# Patient Record
Sex: Male | Born: 1985 | Race: Black or African American | Hispanic: No | Marital: Single | Smoking: Current every day smoker
Health system: Southern US, Community
[De-identification: ages and names within clinical notes are randomized; demographics above are authoritative.]

## PROBLEM LIST (undated history)

## (undated) DIAGNOSIS — J45909 Unspecified asthma, uncomplicated: Secondary | ICD-10-CM

## (undated) HISTORY — PX: HAND SURGERY: SHX662

---

## 2014-08-28 ENCOUNTER — Emergency Department (HOSPITAL_COMMUNITY): Payer: Self-pay

## 2014-08-28 ENCOUNTER — Encounter (HOSPITAL_COMMUNITY): Payer: Self-pay | Admitting: Emergency Medicine

## 2014-08-28 ENCOUNTER — Observation Stay (HOSPITAL_COMMUNITY)
Admission: EM | Admit: 2014-08-28 | Discharge: 2014-08-29 | Disposition: A | Discharge status: Home / Self Care | Payer: Self-pay | Attending: Emergency Medicine | Admitting: Emergency Medicine

## 2014-08-28 DIAGNOSIS — J45901 Unspecified asthma with (acute) exacerbation: Principal | ICD-10-CM | POA: Diagnosis present

## 2014-08-28 DIAGNOSIS — K226 Gastro-esophageal laceration-hemorrhage syndrome: Secondary | ICD-10-CM

## 2014-08-28 DIAGNOSIS — B9789 Other viral agents as the cause of diseases classified elsewhere: Secondary | ICD-10-CM

## 2014-08-28 DIAGNOSIS — J069 Acute upper respiratory infection, unspecified: Secondary | ICD-10-CM | POA: Insufficient documentation

## 2014-08-28 DIAGNOSIS — F17200 Nicotine dependence, unspecified, uncomplicated: Secondary | ICD-10-CM | POA: Insufficient documentation

## 2014-08-28 DIAGNOSIS — R000 Tachycardia, unspecified: Secondary | ICD-10-CM | POA: Insufficient documentation

## 2014-08-28 DIAGNOSIS — E876 Hypokalemia: Secondary | ICD-10-CM | POA: Insufficient documentation

## 2014-08-28 HISTORY — DX: Unspecified asthma, uncomplicated: J45.909

## 2014-08-28 LAB — CBC WITH DIFFERENTIAL/PLATELET
Basophils Absolute: 0.1 10*3/uL (ref 0.0–0.1)
Basophils Relative: 1 % (ref 0–1)
Eosinophils Absolute: 0.2 10*3/uL (ref 0.0–0.7)
Eosinophils Relative: 2 % (ref 0–5)
HCT: 43.8 % (ref 39.0–52.0)
Hemoglobin: 15.1 g/dL (ref 13.0–17.0)
Lymphocytes Relative: 21 % (ref 12–46)
Lymphs Abs: 2.2 10*3/uL (ref 0.7–4.0)
MCH: 29.2 pg (ref 26.0–34.0)
MCHC: 34.5 g/dL (ref 30.0–36.0)
MCV: 84.7 fL (ref 78.0–100.0)
Monocytes Absolute: 0.7 10*3/uL (ref 0.1–1.0)
Monocytes Relative: 7 % (ref 3–12)
Neutro Abs: 7.3 10*3/uL (ref 1.7–7.7)
Neutrophils Relative %: 69 % (ref 43–77)
Platelets: 287 10*3/uL (ref 150–400)
RBC: 5.17 MIL/uL (ref 4.22–5.81)
RDW: 12.8 % (ref 11.5–15.5)
WBC: 10.5 10*3/uL (ref 4.0–10.5)

## 2014-08-28 LAB — BASIC METABOLIC PANEL
Anion gap: 16 — ABNORMAL HIGH (ref 5–15)
BUN: 9 mg/dL (ref 6–23)
CO2: 22 mEq/L (ref 19–32)
Calcium: 9.1 mg/dL (ref 8.4–10.5)
Chloride: 101 mEq/L (ref 96–112)
Creatinine, Ser: 1.1 mg/dL (ref 0.50–1.35)
GFR calc Af Amer: >90 mL/min (ref >90)
GFR calc non Af Amer: >90 mL/min (ref >90)
Glucose, Bld: 111 mg/dL — ABNORMAL HIGH (ref 70–99)
Potassium: 3 mEq/L — ABNORMAL LOW (ref 3.7–5.3)
Sodium: 139 mEq/L (ref 137–147)

## 2014-08-28 MED ORDER — HYDROCODONE-ACETAMINOPHEN 5-325 MG PO TABS
2.0000 | ORAL_TABLET | Freq: Once | ORAL | Status: AC
  Administered 2014-08-28: 2 via ORAL
  Filled 2014-08-28: qty 2

## 2014-08-28 MED ORDER — METHYLPREDNISOLONE SODIUM SUCC 125 MG IJ SOLR
125.0000 mg | Freq: Once | INTRAMUSCULAR | Status: AC
  Administered 2014-08-29: 125 mg via INTRAVENOUS
  Filled 2014-08-28: qty 2

## 2014-08-28 MED ORDER — ALBUTEROL (5 MG/ML) CONTINUOUS INHALATION SOLN
10.0000 mg/h | INHALATION_SOLUTION | RESPIRATORY_TRACT | Status: AC
  Administered 2014-08-28: 10 mg/h via RESPIRATORY_TRACT
  Filled 2014-08-28: qty 20

## 2014-08-28 MED ORDER — HYDROCODONE-HOMATROPINE 5-1.5 MG/5ML PO SYRP
5.0000 mL | ORAL_SOLUTION | ORAL | Status: DC | PRN
  Administered 2014-08-29: 5 mL via ORAL
  Filled 2014-08-28: qty 5

## 2014-08-28 MED ORDER — IPRATROPIUM-ALBUTEROL 0.5-2.5 (3) MG/3ML IN SOLN
3.0000 mL | Freq: Once | RESPIRATORY_TRACT | Status: AC
Start: 2014-08-28 — End: 2014-08-28
  Administered 2014-08-28: 3 mL via RESPIRATORY_TRACT
  Filled 2014-08-28: qty 3

## 2014-08-28 MED ORDER — IPRATROPIUM BROMIDE 0.02 % IN SOLN
0.5000 mg | Freq: Once | RESPIRATORY_TRACT | Status: AC
  Administered 2014-08-28: 0.5 mg via RESPIRATORY_TRACT
  Filled 2014-08-28: qty 2.5

## 2014-08-28 MED ORDER — ALBUTEROL SULFATE (2.5 MG/3ML) 0.083% IN NEBU
2.5000 mg | INHALATION_SOLUTION | Freq: Once | RESPIRATORY_TRACT | Status: AC
  Administered 2014-08-28: 2.5 mg via RESPIRATORY_TRACT

## 2014-08-28 NOTE — ED Notes (Signed)
C/o sob, cough, wheeze and fever, onset Friday.

## 2014-08-28 NOTE — ED Provider Notes (Signed)
CSN: [id number]     Arrival date & time 08/28/14  2210 History   First MD Initiated Contact with Patient 08/28/14 2306     Chief Complaint  Patient presents with  . Shortness of Breath  . Cough  . Wheezing  . Fever     (Consider location/radiation/quality/duration/timing/severity/associated sxs/prior Treatment) The history is provided by the patient and a significant other. No language interpreter was used.    Jose Diaz is a 28 y.o. male  with a hx of asthma presents to the Emergency Department complaining of gradual, persistent, progressively worsening shortness of breath, chest pain and wheezing onset 3 days ago, worsening today.  Patient reports history of asthma since childhood. He is not followed by a primary care physician and does not have an albuterol inhaler at home. He reports URI symptoms including rhinorrhea, postnasal drip, sore throat, generalized headache and nausea beginning Friday with increasing shortness of breath worsening yesterday. He reports he was seen at [facility] emergency department, given by mouth prednisone and 3 breathing treatments. He reports he was discharged home with a prescription for steroids but was not given were prescribed albuterol inhaler. He reports his shortness of breath cough and wheezing increased again today while he was visiting family here in [place]. This prompted his visit to the emergency department tonight. No aggravating or alleviating factors..  Pt denies neck pain, neck stiffness, abdominal pain, nausea vomiting, diarrhea, weakness, dizziness and syncope.     Past Medical History  Diagnosis Date  . Asthma    Past Surgical History  Procedure Laterality Date  . Hand surgery     History reviewed. No pertinent family history. History  Substance Use Topics  . Smoking status: Current Every Day Smoker  . Smokeless tobacco: Not on file  . Alcohol Use: No    Review of Systems  Constitutional: Positive for fever  (subjective) and fatigue. Negative for chills and appetite change.  HENT: Positive for congestion, postnasal drip, rhinorrhea, sinus pressure and sore throat. Negative for ear discharge, ear pain and mouth sores.   Eyes: Negative for visual disturbance.  Respiratory: Positive for cough, chest tightness, shortness of breath and wheezing. Negative for stridor.   Cardiovascular: Negative for chest pain, palpitations and leg swelling.  Gastrointestinal: Negative for nausea, vomiting, abdominal pain and diarrhea.  Genitourinary: Negative for dysuria, urgency, frequency and hematuria.  Musculoskeletal: Negative for arthralgias, back pain, myalgias and neck stiffness.  Skin: Negative for rash.  Neurological: Positive for headaches. Negative for syncope, light-headedness and numbness.  Hematological: Negative for adenopathy.  Psychiatric/Behavioral: The patient is not nervous/anxious.   All other systems reviewed and are negative.     Allergies  Review of patient's allergies indicates no known allergies.  Home Medications   Prior to Admission medications   Medication Sig Start Date End Date Taking? Authorizing Provider  albuterol (PROVENTIL HFA;VENTOLIN HFA) 108 (90 BASE) MCG/ACT inhaler Inhale 2 puffs into the lungs every 4 (four) hours as needed for wheezing or shortness of breath. 08/29/14   Alynna Hargrove, PA-C  albuterol (PROVENTIL) (2.5 MG/3ML) 0.083% nebulizer solution Take 6 mLs (5 mg total) by nebulization every 4 (four) hours as needed for wheezing or shortness of breath. 08/29/14   Terrah Decoster, PA-C  fluticasone (FLONASE) 50 MCG/ACT nasal spray Place 2 sprays into both nostrils daily. 08/29/14   Nealy Hickmon, PA-C  guaiFENesin (MUCINEX) 600 MG 12 hr tablet Take 2 tablets (1,200 mg total) by mouth 2 (two) times daily. 08/29/14   Dahlia Client Jamauri Kruzel,  PA-C  HYDROcodone-homatropine (HYCODAN) 5-1.5 MG/5ML syrup Take 5 mLs by mouth every 6 (six) hours as needed for cough.  08/29/14   Adaira Centola, PA-C   BP 155/74  Pulse 106  Temp(Src) 99.6 F (37.6 C) (Oral)  Resp 20  Ht 5\' 9"  (1.753 m)  Wt 240 lb (108.863 kg)  BMI 35.43 kg/m2  SpO2 95% Physical Exam  Nursing note and vitals reviewed. Constitutional: He is oriented to person, place, and time. He appears well-developed and well-nourished. No distress.  Awake, alert, nontoxic appearance  HENT:  Head: Normocephalic and atraumatic.  Right Ear: Tympanic membrane, external ear and ear canal normal.  Left Ear: Tympanic membrane, external ear and ear canal normal.  Nose: Mucosal edema and rhinorrhea present. No epistaxis. Right sinus exhibits no maxillary sinus tenderness and no frontal sinus tenderness. Left sinus exhibits no maxillary sinus tenderness and no frontal sinus tenderness.  Mouth/Throat: Uvula is midline, oropharynx is clear and moist and mucous membranes are normal. Mucous membranes are not pale and not cyanotic. No oropharyngeal exudate, posterior oropharyngeal edema, posterior oropharyngeal erythema or tonsillar abscesses.  Eyes: Conjunctivae are normal. Pupils are equal, round, and reactive to light. No scleral icterus.  Neck: Normal range of motion and full passive range of motion without pain. Neck supple.  Cardiovascular: Normal rate, regular rhythm, normal heart sounds and intact distal pulses.   Mild tachycardia  Pulmonary/Chest: Accessory muscle usage present. No stridor. Tachypnea noted. No respiratory distress. He has decreased breath sounds. He has wheezes. He exhibits tenderness.  Equal chest expansion Decreased breath sounds with bilateral inspiratory and expiratory wheezes, supra-clavicular retractions and mild accessory use, cachectic Significant tenderness of the anterior chest on palpation  Abdominal: Soft. Bowel sounds are normal. He exhibits no mass. There is no tenderness. There is no rebound and no guarding.  Musculoskeletal: Normal range of motion. He exhibits no edema.   Lymphadenopathy:    He has no cervical adenopathy.  Neurological: He is alert and oriented to person, place, and time. He exhibits normal muscle tone. Coordination normal.  Speech is clear and goal oriented Moves extremities without ataxia  Skin: Skin is warm and dry. No rash noted. He is not diaphoretic.  Psychiatric: He has a normal mood and affect.    ED Course  Procedures (including critical care time) Labs Review Labs Reviewed  BASIC METABOLIC PANEL - Abnormal; Notable for the following:    Potassium 3.0 (*)    Glucose, Bld 111 (*)    Anion gap 16 (*)    All other components within normal limits  CBC WITH DIFFERENTIAL    Imaging Review Dg Chest 2 View  08/29/2014   CLINICAL DATA:  Acute onset of bilateral chest pain and shortness of breath. Current history of moderate asthma. Initial encounter.  EXAM: CHEST  2 VIEW  COMPARISON:  None.  FINDINGS: The lungs are well-aerated. Peribronchial thickening likely reflects the patient's history of asthma. There is no evidence of focal opacification, pleural effusion or pneumothorax.  The heart is normal in size; the mediastinal contour is within normal limits. No acute osseous abnormalities are seen.  IMPRESSION: 1. Peribronchial thickening likely reflects the patient's history of asthma. 2. Otherwise unremarkable chest radiograph.   Electronically Signed   By: Roanna Raider M.D.   On: 08/29/2014 00:22     EKG Interpretation None      MDM   Final diagnoses:  Hypokalemia  Asthma exacerbation  Viral URI with cough   Jose Diaz presents with asthma  exacerbation and URI.  Patient given initial do a neb at triage with minimal improvement. We'll give continuous nebulizer and reassess. Patient will be given Solu-Medrol.  1:27 AM Patient ambulated in ED with O2 saturations maintained >90, no current signs of respiratory distress. Lung exam improved after nebulizer treatment. Prednisone given in the ED and pt will be dc with 5  day burst. Pt states they are breathing at baseline. Pt CXR negative for acute infiltrate. Patients symptoms are consistent with URI, likely viral etiology. Discussed that antibiotics are not indicated for viral infections.   BP 141/70  Pulse 90  Temp(Src) 97.8 F (36.6 C) (Oral)  Resp 16  Ht 5\' 9"  (1.753 m)  Wt 240 lb (108.863 kg)  BMI 35.43 kg/m2  SpO2 99%   3:17 AM Just prior to discharge patient vomited; retching significantly with second episode including large amount of bright red blood. Hemoccult card positive for bloody emesis.  Patient without abdominal pain.  Abdomen remaines soft and nontender.  Likely Mallory-Weiss tear after significant retching.  Patient has risk factors for significant bleeding including alcoholism, NSAID use, anticoagulants, history of peptic ulcer disease. Nausea and emesis is likely secondary to administration of potassium on an empty stomach.  Pt reports feeling SOB again with inspiratory wheezes.  Pt with mild hypoxia at 90-91% while at rest, down from 98% while ambulating approx 1 hour ago.  Pt also with tachycardia.  Pt seen and treated last night in ED without continued resolution at this time.  Will redose albuterol and admit .  The patient was discussed with and seen by Dr. Juleen ChinaKohut who agrees with the treatment plan.   3:36 AM Discussed with Dr. Toniann FailKakrakandy who will admit to med-surg.  3:44 AM Pt with tachycardia to 140 - bed placement changed to tele.  3:48 AM Pt has now eloped from the ED after removing his IV.    BP 155/74  Pulse 106  Temp(Src) 99.6 F (37.6 C) (Oral)  Resp 20  Ht 5\' 9"  (1.753 m)  Wt 240 lb (108.863 kg)  BMI 35.43 kg/m2  SpO2 95%   Jose ForthHannah Ayush Boulet, PA-C 08/29/14 [phone]

## 2014-08-28 NOTE — ED Notes (Signed)
The pt just went for a chest xray

## 2014-08-29 DIAGNOSIS — J45901 Unspecified asthma with (acute) exacerbation: Secondary | ICD-10-CM | POA: Diagnosis present

## 2014-08-29 MED ORDER — POTASSIUM CHLORIDE 10 MEQ/100ML IV SOLN
10.0000 meq | INTRAVENOUS | Status: DC
Start: 2014-08-29 — End: 2014-08-29

## 2014-08-29 MED ORDER — IPRATROPIUM BROMIDE 0.02 % IN SOLN: 1.0000 mg | Freq: Once | RESPIRATORY_TRACT | Status: DC

## 2014-08-29 MED ORDER — GUAIFENESIN ER 600 MG PO TB12: 1200.0000 mg | ORAL_TABLET | Freq: Two times a day (BID) | ORAL | Status: DC

## 2014-08-29 MED ORDER — HYDROCODONE-HOMATROPINE 5-1.5 MG/5ML PO SYRP: 5.0000 mL | ORAL_SOLUTION | Freq: Four times a day (QID) | ORAL | Status: DC | PRN

## 2014-08-29 MED ORDER — ONDANSETRON HCL 4 MG/2ML IJ SOLN: 4.0000 mg | Freq: Three times a day (TID) | INTRAMUSCULAR | Status: DC | PRN

## 2014-08-29 MED ORDER — ALBUTEROL (5 MG/ML) CONTINUOUS INHALATION SOLN: 10.0000 mg/h | INHALATION_SOLUTION | RESPIRATORY_TRACT | Status: DC

## 2014-08-29 MED ORDER — ALBUTEROL SULFATE (2.5 MG/3ML) 0.083% IN NEBU: 5.0000 mg | INHALATION_SOLUTION | RESPIRATORY_TRACT | Status: DC | PRN

## 2014-08-29 MED ORDER — PROMETHAZINE HCL 25 MG PO TABS: 25.0000 mg | ORAL_TABLET | Freq: Four times a day (QID) | ORAL | Status: DC | PRN

## 2014-08-29 MED ORDER — ALBUTEROL SULFATE (2.5 MG/3ML) 0.083% IN NEBU
5.0000 mg | INHALATION_SOLUTION | Freq: Once | RESPIRATORY_TRACT | Status: AC
  Administered 2014-08-29: 5 mg via RESPIRATORY_TRACT
  Filled 2014-08-29: qty 6

## 2014-08-29 MED ORDER — AEROCHAMBER PLUS W/MASK MISC
1.0000 | Freq: Once | Status: DC
  Filled 2014-08-29: qty 1

## 2014-08-29 MED ORDER — POTASSIUM CHLORIDE CRYS ER 20 MEQ PO TBCR
80.0000 meq | EXTENDED_RELEASE_TABLET | Freq: Two times a day (BID) | ORAL | Status: DC
  Administered 2014-08-29: 80 meq via ORAL
  Filled 2014-08-29: qty 4

## 2014-08-29 MED ORDER — ALBUTEROL SULFATE HFA 108 (90 BASE) MCG/ACT IN AERS: 2.0000 | INHALATION_SPRAY | RESPIRATORY_TRACT | Status: DC | PRN

## 2014-08-29 MED ORDER — FLUTICASONE PROPIONATE 50 MCG/ACT NA SUSP: 2.0000 | Freq: Every day | NASAL | Status: DC

## 2014-08-29 MED ORDER — ALBUTEROL SULFATE HFA 108 (90 BASE) MCG/ACT IN AERS
2.0000 | INHALATION_SPRAY | RESPIRATORY_TRACT | Status: DC | PRN
Start: 2014-08-29 — End: 2014-08-29
  Administered 2014-08-29: 2 via RESPIRATORY_TRACT
  Filled 2014-08-29: qty 6.7

## 2014-08-29 MED ORDER — ONDANSETRON HCL 4 MG/2ML IJ SOLN
4.0000 mg | Freq: Once | INTRAMUSCULAR | Status: AC
  Administered 2014-08-29: 4 mg via INTRAVENOUS
  Filled 2014-08-29: qty 2

## 2014-08-29 NOTE — ED Notes (Signed)
While ambulating pt he was sat at 98% the whole time while walking, pt did complain of feeling tired while walking, but states that his breathing seems to be better then when he first arrived.

## 2014-08-29 NOTE — ED Notes (Signed)
Bed cancelled

## 2014-08-29 NOTE — ED Notes (Signed)
The pt was not in his room when i came back from talking to the pa.  His iv had been pulled out .  All his leads pulse ox etc lying in the  Floor.  He had wanted to be admitted     A little earlier.  No one saw him and the male with him walk out.  He left his inhaler and spacer

## 2014-08-29 NOTE — Discharge Instructions (Signed)
1. Medications: flonase, mucinex, hycodan, albuterol neb, albuterol mdi, usual home medications 2. Treatment: rest, drink plenty of fluids, take tylenol or ibuprofen for fever control 3. Follow Up: Please followup with your primary doctor for discussion of your diagnoses and further evaluation after today's visit; if you do not have a primary care doctor use the resource guide provided to find one;    Asthma, Acute Bronchospasm Acute bronchospasm caused by asthma is also referred to as an asthma attack. Bronchospasm means your air passages become narrowed. The narrowing is caused by inflammation and tightening of the muscles in the air tubes (bronchi) in your lungs. This can make it hard to breathe or cause you to wheeze and cough. CAUSES Possible triggers are:  Animal dander from the skin, hair, or feathers of animals.  Dust mites contained in house dust.  Cockroaches.  Pollen from trees or grass.  Mold.  Cigarette or tobacco smoke.  Air pollutants such as dust, household cleaners, hair sprays, aerosol sprays, paint fumes, strong chemicals, or strong odors.  Cold air or weather changes. Cold air may trigger inflammation. Winds increase molds and pollens in the air.  Strong emotions such as crying or laughing hard.  Stress.  Certain medicines such as aspirin or beta-blockers.  Sulfites in foods and drinks, such as dried fruits and wine.  Infections or inflammatory conditions, such as a flu, cold, or inflammation of the nasal membranes (rhinitis).  Gastroesophageal reflux disease (GERD). GERD is a condition where stomach acid backs up into your esophagus.  Exercise or strenuous activity. SIGNS AND SYMPTOMS   Wheezing.  Excessive coughing, particularly at night.  Chest tightness.  Shortness of breath. DIAGNOSIS  Your health care provider will ask you about your medical history and perform a physical exam. A chest X-ray or blood testing may be performed to look for other  causes of your symptoms or other conditions that may have triggered your asthma attack. TREATMENT  Treatment is aimed at reducing inflammation and opening up the airways in your lungs. Most asthma attacks are treated with inhaled medicines. These include quick relief or rescue medicines (such as bronchodilators) and controller medicines (such as inhaled corticosteroids). These medicines are sometimes given through an inhaler or a nebulizer. Systemic steroid medicine taken by mouth or given through an IV tube also can be used to reduce the inflammation when an attack is moderate or severe. Antibiotic medicines are only used if a bacterial infection is present.  HOME CARE INSTRUCTIONS   Rest.  Drink plenty of liquids. This helps the mucus to remain thin and be easily coughed up. Only use caffeine in moderation and do not use alcohol until you have recovered from your illness.  Do not smoke. Avoid being exposed to secondhand smoke.  You play a critical role in keeping yourself in good health. Avoid exposure to things that cause you to wheeze or to have breathing problems.  Keep your medicines up-to-date and available. Carefully follow your health care provider's treatment plan.  Take your medicine exactly as prescribed.  When pollen or pollution is bad, keep windows closed and use an air conditioner or go to places with air conditioning.  Asthma requires careful medical care. See your health care provider for a follow-up as advised. If you are more than [redacted] weeks pregnant and you were prescribed any new medicines, let your obstetrician know about the visit and how you are doing. Follow up with your health care provider as directed.  After you have recovered  from your asthma attack, make an appointment with your outpatient doctor to talk about ways to reduce the likelihood of future attacks. If you do not have a doctor who manages your asthma, make an appointment with a primary care doctor to  discuss your asthma. SEEK IMMEDIATE MEDICAL CARE IF:   You are getting worse.  You have trouble breathing. If severe, call your local emergency services (911 in the U.[place]  You develop chest pain or discomfort.  You are vomiting.  You are not able to keep fluids down.  You are coughing up yellow, green, brown, or bloody sputum.  You have a fever and your symptoms suddenly get worse.  You have trouble swallowing. MAKE SURE YOU:   Understand these instructions.  Will watch your condition.  Will get help right away if you are not doing well or get worse. Document Released: 02/26/2007 Document Revised: 11/16/2013 Document Reviewed: 05/19/2013 [facility] Patient Information 2015 [place], [place]. This information is not intended to replace advice given to you by your health care provider. Make sure you discuss any questions you have with your health care provider.   Upper Respiratory Infection, Adult An upper respiratory infection (URI) is also known as the common cold. It is often caused by a type of germ (virus). Colds are easily spread (contagious). You can pass it to others by kissing, coughing, sneezing, or drinking out of the same glass. Usually, you get better in 1 or 2 weeks.  HOME CARE   Only take medicine as told by your doctor.  Use a warm mist humidifier or breathe in steam from a hot shower.  Drink enough water and fluids to keep your pee (urine) clear or pale yellow.  Get plenty of rest.  Return to work when your temperature is back to normal or as told by your doctor. You may use a face mask and wash your hands to stop your cold from spreading. GET HELP RIGHT AWAY IF:   After the first few days, you feel you are getting worse.  You have questions about your medicine.  You have chills, shortness of breath, or brown or red spit (mucus).  You have yellow or brown snot (nasal discharge) or pain in the face, especially when you bend forward.  You have a fever,  puffy (swollen) neck, pain when you swallow, or white spots in the back of your throat.  You have a bad headache, ear pain, sinus pain, or chest pain.  You have a high-pitched whistling sound when you breathe in and out (wheezing).  You have a lasting cough or cough up blood.  You have sore muscles or a stiff neck. MAKE SURE YOU:   Understand these instructions.  Will watch your condition.  Will get help right away if you are not doing well or get worse. Document Released: 04/29/2008 Document Revised: 02/03/2012 Document Reviewed: 02/16/2014 [facility] Patient Information 2015 [place], [place]. This information is not intended to replace advice given to you by your health care provider. Make sure you discuss any questions you have with your health care provider.   Emergency Department Resource Guide 1) Find a Doctor and Pay Out of Pocket Although you won't have to find out who is covered by your insurance plan, it is a good idea to ask around and get recommendations. You will then need to call the office and see if the doctor you have chosen will accept you as a new patient and what types of options they offer for patients  who are self-pay. Some doctors offer discounts or will set up payment plans for their patients who do not have insurance, but you will need to ask so you aren't surprised when you get to your appointment.  2) Contact Your Local Health Department Not all health departments have doctors that can see patients for sick visits, but many do, so it is worth a call to see if yours does. If you don't know where your local health department is, you can check in your phone book. The CDC also has a tool to help you locate your state's health department, and many state websites also have listings of all of their local health departments.  3) Find a Walk-in Clinic If your illness is not likely to be very severe or complicated, you may want to try a walk in clinic. These are popping up  all over the country in pharmacies, drugstores, and shopping centers. They're usually staffed by nurse practitioners or physician assistants that have been trained to treat common illnesses and complaints. They're usually fairly quick and inexpensive. However, if you have serious medical issues or chronic medical problems, these are probably not your best option.  No Primary Care Doctor: - Call Health Connect at  [phone] - they can help you locate a primary care doctor that  accepts your insurance, provides certain services, etc. - Physician Referral Service- [phone]  Chronic Pain Problems: Organization         Address  Phone   Notes  [facility]  [phone] Patients need to be referred by their primary care doctor.   Medication Assistance: Organization         Address  Phone   Notes  [facility] Medication [facility] [address] [place] [place], [place] [address] [phone] --Must be a resident of [facility] -- Must have NO insurance coverage whatsoever (no Medicaid/ Medicare, etc.) -- The pt. MUST have a primary care doctor that directs their care regularly and follows them in the community   [facility]  [phone]   [employer]  [phone]    Agencies that provide inexpensive medical care: Organization         Address  Phone   Notes  [facility]  [phone]   [facility]    [phone]   [facility] [address] [place], [place] [address] [phone]   [facility] [address] [place], [place] [phone]   [facility]anned Parenthood    [phone]   [facility]    [phone]   [facility] [place]  [address], [place] Phone:  [phone], Fax:  [phone] Hours of Operation:  9 am - 6 pm, M-F.  Also accepts Medicaid/Medicare and self-pay.  [facility]  [address], [place] Phone: [phone], Fax: [phone]. Hours of Operation:  8:30 am - 5:30 pm, M-F.  Also accepts Medicaid and self-pay.  [facility] [place] [address], [place] Phone: [phone]   [facility] [address] Natasha Bence [place], [place] [phone], Ext. [phone] Mondays & Thursdays: 7-9 AM.  First 15 patients are seen on a first come, first serve basis.    Medicaid-accepting [facility] Providers:  Organization         Address  Phone   Notes  [facility] [address], [place] [phone] Also accepts self-pay patients.  [facility] [address] Laurell Josephs [place], [place]  [phone]   [facility] [address], [place] [phone]   [facility] [address] [place] [phone]   Renaye Rakers [address], [place]   [phone] Only accepts [place] Access [place] patients after they have their name applied to their card.   Self-Pay (no insurance) in [facility]:  Organization         Address  Phone   Notes  Sickle Cell Patients, [facility] [address] [place], [place] [phone]   [facility] [address] [place], [place] [phone]   [facility]  [address], [place] [phone]   [facility]/Dr. Osei-Bonsu  [address], [place] or [address], [place] [phone] Phone number for both [place] and [place] locations is the same.  Urgent Medical and [facility] [address] [place] [phone]   [facility] [address] [place] or [address] [phone]   [facility] [address], [place] [phone], phone; [phone], fax Sees patients 1st and 3rd Saturday of every  month.  Must not qualify for public or private insurance (i.e. Medicaid, Medicare, [place] Health Choice, Veterans' Benefits)  Household income should be no more than 200% of the poverty level The clinic cannot treat you if you are pregnant or think you are pregnant  Sexually transmitted diseases are not treated at the clinic.    Dental Care: Organization         Address  Phone  Notes  [facility] Department of [facility] [address] [place], [place] [phone] Accepts children up to age 21 who are enrolled in [place] or [place] Health Choice; pregnant women with a Medicaid card; and children who have applied for Medicaid or [place] Health Choice, but were declined, whose parents can pay a reduced fee at time of service.  [facility] of [facility]  [address], [place] [phone] Accepts children up to age 53 who are enrolled in [place] or [place] Health Choice; pregnant women with a Medicaid card; and children who have applied for Medicaid or  Health Choice, but were declined, whose parents can pay a reduced fee at time of service.  [facility]  [address] [place], [place] [phone] Patients are seen by appointment only. Walk-ins are not accepted. [facility] will see patients 8 years of age and older. Monday - Tuesday (8am-5pm) Most Wednesdays (8:30-5pm) $30 per visit, cash only  [facility]  [address], [facility] [phone] Patients are seen by appointment only. Walk-ins are not accepted. [facility] will see patients 20 years of age and older. One Wednesday Evening (Monthly: Volunteer Based).  $30 per visit, cash only  [employer]  [phone] for adults; Children under age 4, call Graduate Pediatric Dentistry at [phone]. Children aged 62-14, please call [phone] to request a pediatric application.  Dental  services are provided in all areas of dental care including fillings, crowns and bridges, complete and partial dentures, implants, gum treatment, root  canals, and extractions. Preventive care is also provided. Treatment is provided to both adults and children. Patients are selected via a lottery and there is often a waiting list.   [facility] [address] [place]  [phone] [email or web address]   [facility] [address] [place], [place] [phone], Ext. [phone] Second and Fourth Thursday of each month, opens at 6:30 AM; Clinic ends at 9 AM.  Patients are seen on a first-come first-served basis, and a limited number are seen during each clinic.   [facility]  [address] Ether Griffins [place], [place] [phone]   Eligibility Requirements You must have lived in [place], [place], or [place] counties for at least the last three months.   You cannot be eligible for state or federal sponsored [place], including [employer], [place], or [employer].   You generally cannot be eligible for healthcare insurance through your employer.    How to apply: Eligibility screenings are held every Tuesday and Wednesday afternoon from 1:00 pm until 4:00 pm. You do not need an appointment for the interview!  [facility] [address] [place], [place] [phone]   [facility] Health Department  [phone]   [facility] Health Department  [phone]   [facility] Health Department  [phone]    Behavioral Health Resources in the Community: Intensive Outpatient Programs Organization         Address  Phone  Notes  [facility] [address] [place], [place] [phone]   [facility] [address], [place], [place] [phone]   [facility] [address], [place], [place]  [phone]   [facility] Mental Health [address],    [place], [place] [phone] or [phone]   Substance Abuse Resources Organization         Address  Phone  Notes  [facility] Drug Services  [phone]   [facility]  [phone]   [facility] [place]  [phone]   Floydene Flock  [phone]   Residential & Outpatient Substance Abuse Program  [phone]   Psychological Services Organization         Address  Phone  Notes  [facility] Behavioral Health  [phone]   [facility] Services  [phone]   [facility] Mental Health [address] [place] [phone] or [phone]    Mobile Crisis Teams Organization         Address  Phone  Notes  [facility], Mobile Crisis Care Unit  [phone]   [facility]  [address] [place], [place] [phone]   Doristine Locks [address] [place] [phone]    Self-Help/Support Groups Organization         Address  Phone             Notes  [facility] [place] - variety of support groups  336- I7437963 Call for more information  Narcotics Anonymous (NA), [facility] [address] [place] [address], [employer] [place]  2 meetings at this location   Statistician         Address  Phone  Notes  [facility] [address],    [place]  [phone]   [facility]  [address], [place] [address], [place], [place] [phone]   [facility] [address] [place], [place] [phone] Admissions: 8am-3pm M-F  [facility]  [address],    [place], [place] [phone]   [facility] [address] [place], [place], [place] [phone]   [facility] [address]  [place], [place] [phone]   [facility] - Intensive Outpatient [address] Laurell Josephs [address], [place], [place] [phone]   [facility] ([facility] [address] [place]  [place], [place]  [phone] or [phone]   [facility] ([facility]) [address] [place] [address], [place], [place] [phone] Accepts Medicaid  [facility] [address]  [place] [phone] Substance Abuse/Addiction Treatment   [facility] Organization         Address  Phone  Notes  [facility]  9147910028   Angie Fava, PhD [address] Ervin Knack [place], [place]   [phone] or [phone]   [facility] Behavioral   [address] [place], [place] [phone]   [facility] [address], [employer] [employer], [place] [phone] Insurance/Medicaid/sponsorship through [facility] and Families [address]                                    [place], [place] [phone] Therapy/tele-psych/case  [facility] [address], [place] [phone]    Dr. Lolly Mustache  [phone]   [facility]  [employer] [facility] Dept. 1) [address], [place] 2) [address] [place] 3)  [address], [place] [phone]  [phone]   [facility] Child Abuse Hotline 423-059-7079 or [phone] (After Hours)

## 2014-08-30 ENCOUNTER — Emergency Department (HOSPITAL_COMMUNITY): Payer: MEDICAID

## 2014-08-30 ENCOUNTER — Encounter (HOSPITAL_COMMUNITY): Payer: Self-pay | Admitting: Emergency Medicine

## 2014-08-30 ENCOUNTER — Inpatient Hospital Stay (HOSPITAL_COMMUNITY)
Admission: EM | Admit: 2014-08-30 | Discharge: 2014-09-02 | DRG: 202 | Disposition: A | Discharge status: Home / Self Care | Payer: Self-pay | Attending: Internal Medicine | Admitting: Internal Medicine

## 2014-08-30 DIAGNOSIS — F17210 Nicotine dependence, cigarettes, uncomplicated: Secondary | ICD-10-CM | POA: Diagnosis present

## 2014-08-30 DIAGNOSIS — E873 Alkalosis: Secondary | ICD-10-CM

## 2014-08-30 DIAGNOSIS — J189 Pneumonia, unspecified organism: Secondary | ICD-10-CM | POA: Diagnosis present

## 2014-08-30 DIAGNOSIS — D72829 Elevated white blood cell count, unspecified: Secondary | ICD-10-CM | POA: Diagnosis present

## 2014-08-30 DIAGNOSIS — E874 Mixed disorder of acid-base balance: Secondary | ICD-10-CM | POA: Diagnosis present

## 2014-08-30 DIAGNOSIS — E872 Acidosis, unspecified: Secondary | ICD-10-CM

## 2014-08-30 DIAGNOSIS — Z720 Tobacco use: Secondary | ICD-10-CM

## 2014-08-30 DIAGNOSIS — J45901 Unspecified asthma with (acute) exacerbation: Principal | ICD-10-CM | POA: Diagnosis present

## 2014-08-30 DIAGNOSIS — Z23 Encounter for immunization: Secondary | ICD-10-CM

## 2014-08-30 DIAGNOSIS — J9601 Acute respiratory failure with hypoxia: Secondary | ICD-10-CM | POA: Diagnosis present

## 2014-08-30 DIAGNOSIS — J9600 Acute respiratory failure, unspecified whether with hypoxia or hypercapnia: Secondary | ICD-10-CM | POA: Diagnosis present

## 2014-08-30 DIAGNOSIS — R079 Chest pain, unspecified: Secondary | ICD-10-CM | POA: Diagnosis present

## 2014-08-30 DIAGNOSIS — Z590 Homelessness: Secondary | ICD-10-CM

## 2014-08-30 DIAGNOSIS — R9431 Abnormal electrocardiogram [ECG] [EKG]: Secondary | ICD-10-CM | POA: Diagnosis present

## 2014-08-30 DIAGNOSIS — K219 Gastro-esophageal reflux disease without esophagitis: Secondary | ICD-10-CM | POA: Diagnosis present

## 2014-08-30 LAB — BASIC METABOLIC PANEL
Anion gap: 21 — ABNORMAL HIGH (ref 5–15)
BUN: 12 mg/dL (ref 6–23)
CALCIUM: 9.3 mg/dL (ref 8.4–10.5)
CO2: 15 meq/L — AB (ref 19–32)
CREATININE: 0.95 mg/dL (ref 0.50–1.35)
Chloride: 105 mEq/L (ref 96–112)
GFR calc Af Amer: >90 mL/min (ref >90)
Glucose, Bld: 133 mg/dL — ABNORMAL HIGH (ref 70–99)
Potassium: 4.1 mEq/L (ref 3.7–5.3)
SODIUM: 141 meq/L (ref 137–147)

## 2014-08-30 LAB — I-STAT ARTERIAL BLOOD GAS, ED
ACID-BASE DEFICIT: 4 mmol/L — AB (ref 0.0–2.0)
BICARBONATE: 18.2 meq/L — AB (ref 20.0–24.0)
O2 SAT: 94 %
Patient temperature: 98.6
TCO2: 19 mmol/L (ref 0–100)
pCO2 arterial: 24.4 mmHg — ABNORMAL LOW (ref 35.0–45.0)
pH, Arterial: 7.48 — ABNORMAL HIGH (ref 7.350–7.450)
pO2, Arterial: 64 mmHg — ABNORMAL LOW (ref 80.0–100.0)

## 2014-08-30 LAB — CBC
HCT: 42 % (ref 39.0–52.0)
HEMATOCRIT: 37.4 % — AB (ref 39.0–52.0)
Hemoglobin: 14.5 g/dL (ref 13.0–17.0)
Hemoglobin: 13 g/dL (ref 13.0–17.0)
MCH: 29.9 pg (ref 26.0–34.0)
MCH: 29.7 pg (ref 26.0–34.0)
MCHC: 34.5 g/dL (ref 30.0–36.0)
MCHC: 34.8 g/dL (ref 30.0–36.0)
MCV: 86.6 fL (ref 78.0–100.0)
MCV: 85.6 fL (ref 78.0–100.0)
Platelets: 344 10*3/uL (ref 150–400)
Platelets: 326 10*3/uL (ref 150–400)
RBC: 4.85 MIL/uL (ref 4.22–5.81)
RBC: 4.37 MIL/uL (ref 4.22–5.81)
RDW: 13.4 % (ref 11.5–15.5)
RDW: 13.3 % (ref 11.5–15.5)
WBC: 13 10*3/uL — ABNORMAL HIGH (ref 4.0–10.5)
WBC: 14 10*3/uL — ABNORMAL HIGH (ref 4.0–10.5)

## 2014-08-30 LAB — I-STAT TROPONIN, ED: TROPONIN I, POC: 0.01 ng/mL (ref 0.00–0.08)

## 2014-08-30 LAB — RAPID URINE DRUG SCREEN, HOSP PERFORMED
AMPHETAMINES: NOT DETECTED
Barbiturates: NOT DETECTED
Benzodiazepines: NOT DETECTED
Cocaine: NOT DETECTED
Opiates: NOT DETECTED
Tetrahydrocannabinol: POSITIVE — AB

## 2014-08-30 LAB — CREATININE, SERUM
CREATININE: 0.94 mg/dL (ref 0.50–1.35)
GFR calc Af Amer: >90 mL/min (ref >90)
GFR calc non Af Amer: >90 mL/min (ref >90)

## 2014-08-30 MED ORDER — DEXTROSE 5 % IV SOLN
500.0000 mg | INTRAVENOUS | Status: DC
  Administered 2014-08-30 – 2014-09-01 (×3): 500 mg via INTRAVENOUS
  Filled 2014-08-30 (×5): qty 500

## 2014-08-30 MED ORDER — DEXTROSE 5 % IV SOLN
1.0000 g | INTRAVENOUS | Status: DC
  Administered 2014-08-30 – 2014-09-01 (×3): 1 g via INTRAVENOUS
  Filled 2014-08-30 (×4): qty 10

## 2014-08-30 MED ORDER — HEPARIN SODIUM (PORCINE) 5000 UNIT/ML IJ SOLN
5000.0000 [IU] | Freq: Three times a day (TID) | INTRAMUSCULAR | Status: DC
  Administered 2014-08-30 – 2014-08-31 (×2): 5000 [IU] via SUBCUTANEOUS
  Filled 2014-08-30 (×11): qty 1

## 2014-08-30 MED ORDER — LORAZEPAM 2 MG/ML IJ SOLN
0.5000 mg | Freq: Once | INTRAMUSCULAR | Status: AC
  Administered 2014-08-30: 0.5 mg via INTRAVENOUS
  Filled 2014-08-30: qty 1

## 2014-08-30 MED ORDER — IPRATROPIUM-ALBUTEROL 0.5-2.5 (3) MG/3ML IN SOLN
3.0000 mL | Freq: Once | RESPIRATORY_TRACT | Status: AC
  Administered 2014-08-30: 3 mL via RESPIRATORY_TRACT

## 2014-08-30 MED ORDER — METHYLPREDNISOLONE SODIUM SUCC 125 MG IJ SOLR
125.0000 mg | Freq: Once | INTRAMUSCULAR | Status: AC
  Administered 2014-08-30: 125 mg via INTRAVENOUS
  Filled 2014-08-30: qty 2

## 2014-08-30 MED ORDER — ALBUTEROL (5 MG/ML) CONTINUOUS INHALATION SOLN
10.0000 mg/h | INHALATION_SOLUTION | RESPIRATORY_TRACT | Status: AC
  Administered 2014-08-30: 10 mg/h via RESPIRATORY_TRACT
  Filled 2014-08-30: qty 20

## 2014-08-30 MED ORDER — IPRATROPIUM-ALBUTEROL 0.5-2.5 (3) MG/3ML IN SOLN: 3.0000 mL | RESPIRATORY_TRACT | Status: DC | PRN

## 2014-08-30 MED ORDER — ONDANSETRON HCL 4 MG/2ML IJ SOLN
4.0000 mg | Freq: Four times a day (QID) | INTRAMUSCULAR | Status: DC | PRN
  Administered 2014-08-30: 4 mg via INTRAVENOUS
  Filled 2014-08-30: qty 2

## 2014-08-30 MED ORDER — IPRATROPIUM-ALBUTEROL 0.5-2.5 (3) MG/3ML IN SOLN
3.0000 mL | RESPIRATORY_TRACT | Status: DC
  Administered 2014-08-30 (×2): 3 mL via RESPIRATORY_TRACT
  Filled 2014-08-30 (×2): qty 3

## 2014-08-30 MED ORDER — METHYLPREDNISOLONE SODIUM SUCC 125 MG IJ SOLR
125.0000 mg | Freq: Three times a day (TID) | INTRAMUSCULAR | Status: DC
  Administered 2014-08-30 – 2014-08-31 (×2): 125 mg via INTRAVENOUS
  Filled 2014-08-30 (×6): qty 2

## 2014-08-30 MED ORDER — ONDANSETRON HCL 4 MG PO TABS: 4.0000 mg | ORAL_TABLET | Freq: Four times a day (QID) | ORAL | Status: DC | PRN

## 2014-08-30 MED ORDER — SODIUM CHLORIDE 0.9 % IV SOLN
INTRAVENOUS | Status: DC
  Administered 2014-08-30 – 2014-08-31 (×3): via INTRAVENOUS

## 2014-08-30 MED ORDER — IPRATROPIUM-ALBUTEROL 0.5-2.5 (3) MG/3ML IN SOLN
RESPIRATORY_TRACT | Status: AC
  Filled 2014-08-30: qty 3

## 2014-08-30 NOTE — ED Notes (Signed)
Presents with SOB, audible wheezing inspiratory and expiratory, harsh productive cough labored respirations going on for one week. Pt is tachypneic at 34 and was supposed to be admitted 2 days ago but they left AMA due a family emergency. duoneb started at triage, unable to speak in short phrases, diaphoretic.

## 2014-08-30 NOTE — ED Provider Notes (Signed)
CSN: [id number]     Arrival date & time 08/30/14  1459 History   First MD Initiated Contact with Patient 08/30/14 1525     Chief Complaint  Patient presents with  . Shortness of Breath     (Consider location/radiation/quality/duration/timing/severity/associated sxs/prior Treatment) HPI Jose Diaz See is a(n) 28 y.o. male who presents with SOB. Patient was seen on 08/28/2014 For acute asthma exacerbation secondary to URI. Patient Had low 02 saturations at 91-92% on RA following several treatments and was admitted, however he eloped as there was a family emergency. The patient returns today with continued severe sxs including wheezing, sever coughing fits productive of grey sputum, post tussive vomiting of mucous and NBNB vomitus. He has associated tachycardia and sweats with coughing. Other sxs include sore throat, rhinorrhea, generalized headache. He states tha his breathing is better than it was when he came the other day. He is a current daily smoker    Past Medical History  Diagnosis Date  . Asthma    Past Surgical History  Procedure Laterality Date  . Hand surgery     History reviewed. No pertinent family history. History  Substance Use Topics  . Smoking status: Current Every Day Smoker  . Smokeless tobacco: Not on file  . Alcohol Use: No    Review of Systems  Ten systems reviewed and are negative for acute change, except as noted in the HPI.    Allergies  Review of patient's allergies indicates no known allergies.  Home Medications   Prior to Admission medications   Medication Sig Start Date End Date Taking? Authorizing Provider  albuterol (PROVENTIL HFA;VENTOLIN HFA) 108 (90 BASE) MCG/ACT inhaler Inhale 2 puffs into the lungs every 4 (four) hours as needed for wheezing or shortness of breath. 08/29/14  Yes Hannah Muthersbaugh, PA-C   BP 180/85  Pulse 122  Temp(Src) 97.9 F (36.6 C) (Oral)  Resp 25  SpO2 96% Physical Exam  Nursing note and vitals  reviewed. Constitutional: He appears well-developed and well-nourished. No distress.  HENT:  Head: Normocephalic and atraumatic.  Eyes: Conjunctivae are normal. No scleral icterus.  Neck: Normal range of motion. Neck supple.  Cardiovascular: Normal rate, regular rhythm and normal heart sounds.   Pulmonary/Chest: He has wheezes.  Diffuse inspiratory and expiratory wheeze.  Paroxysms of cough lasting several minutes with diaphoresis  Abdominal: Soft. There is no tenderness.  Musculoskeletal: He exhibits no edema.  Neurological: He is alert.  Skin: Skin is warm. He is diaphoretic.  Psychiatric: His behavior is normal.    ED Course  Procedures (including critical care time) Labs Review Labs Reviewed  CBC  BASIC METABOLIC PANEL  I-STAT TROPOININ, ED    Imaging Review Dg Chest 2 View  08/29/2014   CLINICAL DATA:  Acute onset of bilateral chest pain and shortness of breath. Current history of moderate asthma. Initial encounter.  EXAM: CHEST  2 VIEW  COMPARISON:  None.  FINDINGS: The lungs are well-aerated. Peribronchial thickening likely reflects the patient's history of asthma. There is no evidence of focal opacification, pleural effusion or pneumothorax.  The heart is normal in size; the mediastinal contour is within normal limits. No acute osseous abnormalities are seen.  IMPRESSION: 1. Peribronchial thickening likely reflects the patient's history of asthma. 2. Otherwise unremarkable chest radiograph.   Electronically Signed   By: Roanna RaiderJeffery  Chang M.D.   On: 08/29/2014 00:22     EKG Interpretation None      CRITICAL CARE Performed by: Arthor CaptainHarris, Deanglo Hissong   Total critical  care time: 35  Critical care time was exclusive of separately billable procedures and treating other patients.  Critical care was necessary to treat or prevent imminent or life-threatening deterioration.  Critical care was time spent personally by me on the following activities: development of treatment plan with  patient and/or surrogate as well as nursing, discussions with consultants, evaluation of patient's response to treatment, examination of patient, obtaining history from patient or surrogate, ordering and performing treatments and interventions, ordering and review of laboratory studies, ordering and review of radiographic studies, pulse oximetry and re-evaluation of patient's condition.  MDM   Final diagnoses:  None   4:57 PM BP 150/79  Pulse 138  Temp(Src) 97.9 F (36.6 C) (Oral)  Resp 23  SpO2 96% Patient hads tecieved hour long neb. He seems to have improved. He appears to have a leukocytosis, bicarb is low and anion gap is 21. Will obtain an abg. Patient taken off of 02.   Patient ABG shows low arterial 02.  His bicarb is low. He continues to have severe paroxysms of cough, however his wheezing has improved. I have discussed the case with Dr. Alvester Morin who will admit the patient . Patient / Family / Caregiver informed of clinical course, understand medical decision-making process, and agree with plan.    Arthor Captain, PA-C 09/05/14 1240

## 2014-08-30 NOTE — H&P (Signed)
Hospitalist Admission History and Physical  Patient name: Jose Diaz Medical record number: [id number] Date of birth: 09/03/1986 Age: 28 y.o. Gender: male  Primary Care Provider: No PCP Per Patient  Chief Complaint: acute resp failure, asthma exacerbation, tobacco abuse   History of Present Illness:This is a 28 y.o. year old male with significant past medical history of recurrent asthma, 1/2 PPD smoking  presenting with acute resp failure with hypoxia. Pt states that he has had recurrent cough and dyspnea over the past 1-2 weeks. Has been seen several times in ER for sxs over this time frame. Of note, pt is currently living in his car with his girlfriend. States that he has not been able to afford medications prescribed by the ER. Has had some subjective chills at home as well post tussive emesis. Still smoking 1/2 PPD.  Presented to the ER T 98.4, HR 110s-130s, resp 20s-30s, BP 120s-180s/60s-80s, Satting 94% on RA. WBC 13 in setting of recent steroid use, hgb 15, Cr 1.1, K 4.1. CXR w/ hypoventilation and RLL atelectasis. ABG also done showing mixed resp alkalosis and metabolic acidosis.   Assessment and Plan: Jose Diaz is a 28 y.o. year old male presenting with Acute Resp Failure   Active Problems:   Acute respiratory failure   Tobacco abuse   Respiratory alkalosis   Metabolic acidosis   CAP (community acquired pneumonia)   1-Acute resp failure _likely secondary to asthma exacerbation -scheduled nebs and steroids -? Atelectasis vs. Infiltrate on imagine -higher risk for infection given baseline tobacco abuse, duration of sxs, and semi-homeless status -will place on CAP coverage with rocephin and azithro -panculture -follow closely -tobacco abuse counseling  -O2 prn   2-Resp alkalosis/metabolic acidosis  -mixed disorder  -likely multifactorial with contributions from recurrent increased WOB and post tussive emesis -AG 20  -check lactate, ETOH,  -hydrate     FEN/GI: heart healthy diet  Prophylaxis: sub q heparin Disposition: pending further evaluation  Code Status:Full Code    Patient Active Problem List   Diagnosis Date Noted  . Acute respiratory failure 08/30/2014  . Asthma exacerbation 08/29/2014   Past Medical History: Past Medical History  Diagnosis Date  . Asthma     Past Surgical History: Past Surgical History  Procedure Laterality Date  . Hand surgery      Social History: History   Social History  . Marital Status: Single    Spouse Name: N/A    Number of Children: N/A  . Years of Education: N/A   Social History Main Topics  . Smoking status: Current Every Day Smoker  . Smokeless tobacco: None  . Alcohol Use: No  . Drug Use: No  . Sexual Activity: None   Other Topics Concern  . None   Social History Narrative  . None    Family History: History reviewed. No pertinent family history.  Allergies: No Known Allergies  Current Facility-Administered Medications  Medication Dose Route Frequency Provider Last Rate Last Dose  . 0.9 %  sodium chloride infusion   Intravenous Continuous Doree AlbeeSteven Jesi Jurgens, MD      . azithromycin (ZITHROMAX) 500 mg in dextrose 5 % 250 mL IVPB  500 mg Intravenous Q24H Doree AlbeeSteven Koua Deeg, MD      . cefTRIAXone (ROCEPHIN) 1 g in dextrose 5 % 50 mL IVPB  1 g Intravenous Q24H Doree AlbeeSteven Abbagayle Zaragoza, MD      . heparin injection 5,000 Units  5,000 Units Subcutaneous 3 times per day Doree AlbeeSteven Brelynn Wheller, MD      .  ipratropium-albuterol (DUONEB) 0.5-2.5 (3) MG/3ML nebulizer solution 3 mL  3 mL Nebulization Q4H Doree Albee, MD      . ipratropium-albuterol (DUONEB) 0.5-2.5 (3) MG/3ML nebulizer solution 3 mL  3 mL Nebulization Q2H PRN Doree Albee, MD      . methylPREDNISolone sodium succinate (SOLU-MEDROL) 125 mg/2 mL injection 125 mg  125 mg Intravenous 3 times per day Doree Albee, MD      . ondansetron [facility]) tablet 4 mg  4 mg Oral Q6H PRN Doree Albee, MD       Or  . ondansetron [facility]) injection 4  mg  4 mg Intravenous Q6H PRN Doree Albee, MD       Current Outpatient Prescriptions  Medication Sig Dispense Refill  . albuterol (PROVENTIL HFA;VENTOLIN HFA) 108 (90 BASE) MCG/ACT inhaler Inhale 2 puffs into the lungs every 4 (four) hours as needed for wheezing or shortness of breath.  1 Inhaler  3   Review Of Systems: 12 point ROS negative except as noted above in HPI.  Physical Exam: Filed Vitals:   08/30/14 1841  BP: 129/66  Pulse: 123  Temp:   Resp: 23    General: alert, cooperative and mild distress HEENT: PERRLA and extra ocular movement intact Heart: S1, S2 normal, no murmur, rub or gallop, regular rate and rhythm Lungs: expiratory wheezes and rhonchi throughout both lung fields Abdomen: abdomen is soft without significant tenderness, masses, organomegaly or guarding Extremities: extremities normal, atraumatic, no cyanosis or edema Skin:no rashes Neurology: normal without focal findings  Labs and Imaging: Lab Results  Component Value Date/Time   NA 141 08/30/2014  3:17 PM   K 4.1 08/30/2014  3:17 PM   CL 105 08/30/2014  3:17 PM   CO2 15* 08/30/2014  3:17 PM   BUN 12 08/30/2014  3:17 PM   CREATININE 0.95 08/30/2014  3:17 PM   GLUCOSE 133* 08/30/2014  3:17 PM   Lab Results  Component Value Date   WBC 13.0* 08/30/2014   HGB 14.5 08/30/2014   HCT 42.0 08/30/2014   MCV 86.6 08/30/2014   PLT 344 08/30/2014    Dg Chest 2 View  08/29/2014   CLINICAL DATA:  Acute onset of bilateral chest pain and shortness of breath. Current history of moderate asthma. Initial encounter.  EXAM: CHEST  2 VIEW  COMPARISON:  None.  FINDINGS: The lungs are well-aerated. Peribronchial thickening likely reflects the patient's history of asthma. There is no evidence of focal opacification, pleural effusion or pneumothorax.  The heart is normal in size; the mediastinal contour is within normal limits. No acute osseous abnormalities are seen.  IMPRESSION: 1. Peribronchial thickening likely reflects the  patient's history of asthma. 2. Otherwise unremarkable chest radiograph.   Electronically Signed   By: Roanna Raider M.D.   On: 08/29/2014 00:22   Dg Chest Portable 1 View  08/30/2014   CLINICAL DATA:  Short of breath  EXAM: PORTABLE CHEST - 1 VIEW  COMPARISON:  08/28/2014  FINDINGS: Decreased lung volume with mild atelectasis in the right lung base. Negative for heart failure or pneumonia.  IMPRESSION: Mild hypoventilation with right lower lobe atelectasis.   Electronically Signed   By: Marlan Palau M.D.   On: 08/30/2014 16:17           Doree Albee MD  Pager: [phone]

## 2014-08-30 NOTE — ED Notes (Signed)
PA at bedside.

## 2014-08-31 ENCOUNTER — Encounter (HOSPITAL_COMMUNITY): Payer: Self-pay | Admitting: *Deleted

## 2014-08-31 DIAGNOSIS — R079 Chest pain, unspecified: Secondary | ICD-10-CM | POA: Diagnosis present

## 2014-08-31 DIAGNOSIS — J9601 Acute respiratory failure with hypoxia: Secondary | ICD-10-CM

## 2014-08-31 DIAGNOSIS — R9431 Abnormal electrocardiogram [ECG] [EKG]: Secondary | ICD-10-CM | POA: Diagnosis present

## 2014-08-31 DIAGNOSIS — J45901 Unspecified asthma with (acute) exacerbation: Principal | ICD-10-CM

## 2014-08-31 DIAGNOSIS — I517 Cardiomegaly: Secondary | ICD-10-CM

## 2014-08-31 DIAGNOSIS — Z720 Tobacco use: Secondary | ICD-10-CM

## 2014-08-31 LAB — CBC WITH DIFFERENTIAL/PLATELET
BASOS ABS: 0 10*3/uL (ref 0.0–0.1)
BASOS PCT: 0 % (ref 0–1)
EOS PCT: 0 % (ref 0–5)
Eosinophils Absolute: 0 10*3/uL (ref 0.0–0.7)
HCT: 36.6 % — ABNORMAL LOW (ref 39.0–52.0)
Hemoglobin: 12.7 g/dL — ABNORMAL LOW (ref 13.0–17.0)
LYMPHS ABS: 0.9 10*3/uL (ref 0.7–4.0)
Lymphocytes Relative: 7 % — ABNORMAL LOW (ref 12–46)
MCH: 29.7 pg (ref 26.0–34.0)
MCHC: 34.7 g/dL (ref 30.0–36.0)
MCV: 85.5 fL (ref 78.0–100.0)
Monocytes Absolute: 0.3 10*3/uL (ref 0.1–1.0)
Monocytes Relative: 2 % — ABNORMAL LOW (ref 3–12)
NEUTROS PCT: 91 % — AB (ref 43–77)
Neutro Abs: 12.3 10*3/uL — ABNORMAL HIGH (ref 1.7–7.7)
PLATELETS: 327 10*3/uL (ref 150–400)
RBC: 4.28 MIL/uL (ref 4.22–5.81)
RDW: 13.4 % (ref 11.5–15.5)
WBC: 13.6 10*3/uL — ABNORMAL HIGH (ref 4.0–10.5)

## 2014-08-31 LAB — COMPREHENSIVE METABOLIC PANEL
ALT: 34 U/L (ref 0–53)
AST: 19 U/L (ref 0–37)
Albumin: 3.3 g/dL — ABNORMAL LOW (ref 3.5–5.2)
Alkaline Phosphatase: 57 U/L (ref 39–117)
Anion gap: 16 — ABNORMAL HIGH (ref 5–15)
BUN: 12 mg/dL (ref 6–23)
CALCIUM: 8.7 mg/dL (ref 8.4–10.5)
CHLORIDE: 103 meq/L (ref 96–112)
CO2: 21 meq/L (ref 19–32)
Creatinine, Ser: 0.96 mg/dL (ref 0.50–1.35)
GLUCOSE: 150 mg/dL — AB (ref 70–99)
Potassium: 4 mEq/L (ref 3.7–5.3)
SODIUM: 140 meq/L (ref 137–147)
Total Protein: 6.5 g/dL (ref 6.0–8.3)

## 2014-08-31 LAB — D-DIMER, QUANTITATIVE: D-Dimer, Quant: <0.27 ug/mL-FEU (ref 0.00–0.48)

## 2014-08-31 LAB — LACTIC ACID, PLASMA
Lactic Acid, Venous: 3.5 mmol/L — ABNORMAL HIGH (ref 0.5–2.2)
Lactic Acid, Venous: 2 mmol/L (ref 0.5–2.2)

## 2014-08-31 LAB — EXPECTORATED SPUTUM ASSESSMENT W GRAM STAIN, RFLX TO RESP C

## 2014-08-31 LAB — ETHANOL: Alcohol, Ethyl (B): <11 mg/dL (ref 0–11)

## 2014-08-31 LAB — EXPECTORATED SPUTUM ASSESSMENT W REFEX TO RESP CULTURE

## 2014-08-31 LAB — TROPONIN I
Troponin I: <0.3 ng/mL (ref <0.30)
Troponin I: <0.3 ng/mL (ref <0.30)
Troponin I: <0.3 ng/mL (ref <0.30)

## 2014-08-31 LAB — STREP PNEUMONIAE URINARY ANTIGEN: Strep Pneumo Urinary Antigen: NEGATIVE

## 2014-08-31 LAB — HIV ANTIBODY (ROUTINE TESTING W REFLEX): HIV 1&2 Ab, 4th Generation: NONREACTIVE

## 2014-08-31 MED ORDER — ASPIRIN 325 MG PO TABS
325.0000 mg | ORAL_TABLET | Freq: Every day | ORAL | Status: DC
  Administered 2014-08-31 – 2014-09-02 (×3): 325 mg via ORAL
  Filled 2014-08-31 (×3): qty 1

## 2014-08-31 MED ORDER — NITROGLYCERIN 0.4 MG SL SUBL: 0.4000 mg | SUBLINGUAL_TABLET | SUBLINGUAL | Status: DC | PRN

## 2014-08-31 MED ORDER — ALBUTEROL SULFATE (2.5 MG/3ML) 0.083% IN NEBU: 2.5000 mg | INHALATION_SOLUTION | Freq: Four times a day (QID) | RESPIRATORY_TRACT | Status: DC | PRN

## 2014-08-31 MED ORDER — ALBUTEROL SULFATE HFA 108 (90 BASE) MCG/ACT IN AERS: 2.0000 | INHALATION_SPRAY | RESPIRATORY_TRACT | Status: DC | PRN

## 2014-08-31 MED ORDER — ACETAMINOPHEN 325 MG PO TABS
650.0000 mg | ORAL_TABLET | ORAL | Status: DC | PRN
  Administered 2014-08-31: 650 mg via ORAL
  Filled 2014-08-31: qty 2

## 2014-08-31 MED ORDER — IPRATROPIUM-ALBUTEROL 0.5-2.5 (3) MG/3ML IN SOLN
3.0000 mL | Freq: Four times a day (QID) | RESPIRATORY_TRACT | Status: DC
  Administered 2014-08-31 – 2014-09-02 (×8): 3 mL via RESPIRATORY_TRACT
  Filled 2014-08-31 (×9): qty 3

## 2014-08-31 MED ORDER — PANTOPRAZOLE SODIUM 40 MG PO TBEC
40.0000 mg | DELAYED_RELEASE_TABLET | Freq: Every day | ORAL | Status: DC
  Administered 2014-08-31 – 2014-09-01 (×2): 40 mg via ORAL
  Filled 2014-08-31 (×2): qty 1

## 2014-08-31 MED ORDER — GI COCKTAIL ~~LOC~~
30.0000 mL | Freq: Once | ORAL | Status: AC
  Administered 2014-08-31: 30 mL via ORAL
  Filled 2014-08-31: qty 30

## 2014-08-31 MED ORDER — INFLUENZA VAC SPLIT QUAD 0.5 ML IM SUSY
0.5000 mL | PREFILLED_SYRINGE | INTRAMUSCULAR | Status: AC
  Administered 2014-09-01: 0.5 mL via INTRAMUSCULAR
  Filled 2014-08-31: qty 0.5

## 2014-08-31 MED ORDER — HYDROCOD POLST-CHLORPHEN POLST 10-8 MG/5ML PO LQCR
5.0000 mL | Freq: Two times a day (BID) | ORAL | Status: DC | PRN
  Administered 2014-08-31: 5 mL via ORAL
  Filled 2014-08-31: qty 5

## 2014-08-31 MED ORDER — PANTOPRAZOLE SODIUM 40 MG IV SOLR
40.0000 mg | Freq: Every day | INTRAVENOUS | Status: DC
  Administered 2014-08-31: 40 mg via INTRAVENOUS
  Filled 2014-08-31: qty 40

## 2014-08-31 MED ORDER — PREDNISONE 20 MG PO TABS
40.0000 mg | ORAL_TABLET | Freq: Every day | ORAL | Status: DC
  Filled 2014-08-31 (×2): qty 2

## 2014-08-31 MED ORDER — PNEUMOCOCCAL VAC POLYVALENT 25 MCG/0.5ML IJ INJ
0.5000 mL | INJECTION | INTRAMUSCULAR | Status: AC
  Administered 2014-09-01: 0.5 mL via INTRAMUSCULAR
  Filled 2014-08-31: qty 0.5

## 2014-08-31 MED ORDER — ZOLPIDEM TARTRATE 5 MG PO TABS
5.0000 mg | ORAL_TABLET | Freq: Once | ORAL | Status: AC
  Administered 2014-08-31: 5 mg via ORAL
  Filled 2014-08-31: qty 1

## 2014-08-31 MED ORDER — METHYLPREDNISOLONE SODIUM SUCC 40 MG IJ SOLR: 40.0000 mg | Freq: Every day | INTRAMUSCULAR | Status: DC

## 2014-08-31 NOTE — Progress Notes (Signed)
Patient complained of mid chest pain with heartburn. Requesting GI cocktail as he states helped him when given at 0559 this morning. Order was for one time dose and MD made aware of complaints. Orders written and carried out.

## 2014-08-31 NOTE — Progress Notes (Signed)
Patient started on O2@ 2L/min nasal cannula per order.

## 2014-08-31 NOTE — Consult Note (Addendum)
Admit date: 08/30/2014 Referring Physician  Dr. Jerral Ralph Primary Physician  None Primary Cardiologist  None Reason for Consultation  Chest pain  HPI: This is a 28 y.o. year old male with significant past medical history of recurrent asthma, 1/2 PPD smoking presenting with acute resp failure with hypoxia. Pt states that he has had recurrent cough and dyspnea over the past 1-2 weeks. Has been seen several times in ER for sxs over this time frame. Of note, pt is currently living in his car with his girlfriend. States that he has not been able to afford medications prescribed by the ER. Has had some subjective chills at home as well post tussive emesis. Still smoking 1/2 PPD. Presented to the ER T 98.4, HR 110s-130s, resp 20s-30s, BP 120s-180s/60s-80s, Satting 94% on RA. WBC 13 in setting of recent steroid use, hgb 15, Cr 1.1, K 4.1. CXR w/ hypoventilation and RLL atelectasis. ABG also done showing mixed resp alkalosis and metabolic acidosis. He was admitted with acute respiratory failure with ? CAP.  He was started on antibiotics.  He has also been having pleuritic CP that is sharp and occurs with deep breathing and cough.  This has been going on since last Friday.  Yesterday he started having a burning discomfort in his chest that starts in the epigastric region and moves up into his throat along with a sour taste.  He says that he feels the regurgitation into his mouth.  He will then get sharp pains that go from his throat and radiate down the center of his chest and around to his LUQ of his abdomen.  He received a GI cocktail this am which completely relieved the discomfort but now it has come back.  Cardiology was consulted for evaluation of CP.  EKG shows sinus tachycardia with marked LVH with T wave inversions in V3 and V4.       PMH:   Past Medical History  Diagnosis Date  . Asthma      PSH:   Past Surgical History  Procedure Laterality Date  . Hand surgery      Allergies:  Review of  patient's allergies indicates no known allergies. Prior to Admit Meds:   Prescriptions prior to admission  Medication Sig Dispense Refill  . albuterol (PROVENTIL HFA;VENTOLIN HFA) 108 (90 BASE) MCG/ACT inhaler Inhale 2 puffs into the lungs every 4 (four) hours as needed for wheezing or shortness of breath.  1 Inhaler  3   Fam HX:   History reviewed. No pertinent family history. Social HX:    History   Social History  . Marital Status: Single    Spouse Name: N/A    Number of Children: N/A  . Years of Education: N/A   Occupational History  . Not on file.   Social History Main Topics  . Smoking status: Current Every Day Smoker  . Smokeless tobacco: Not on file  . Alcohol Use: No  . Drug Use: No  . Sexual Activity: Yes   Other Topics Concern  . Not on file   Social History Narrative  . No narrative on file     ROS:  All 11 ROS were addressed and are negative except what is stated in the HPI  Physical Exam: Blood pressure 146/88, pulse 97, temperature 98.3 F (36.8 C), temperature source Oral, resp. rate 20, height 6" (0.152 m), weight 246 lb (111.585 kg), SpO2 93.00%.    General: Well developed, well nourished, in no acute distress Head: Eyes  PERRLA, No xanthomas.   Normal cephalic and atramatic  Lungs:   Diffuse rhonchi on right Heart:   HRRR S1 S2 Pulses are 2+ & equal.            No carotid bruit. No JVD.  No abdominal bruits. No femoral bruits. Abdomen: Bowel sounds are positive, abdomen soft and non-tender without masses  Extremities:   No clubbing, cyanosis or edema.  DP +1 Neuro: Alert and oriented X 3. Psych:  Good affect, responds appropriately    Labs:   Lab Results  Component Value Date   WBC 13.6* 08/31/2014   HGB 12.7* 08/31/2014   HCT 36.6* 08/31/2014   MCV 85.5 08/31/2014   PLT 327 08/31/2014     Recent Labs Lab 08/31/14 0234  NA 140  K 4.0  CL 103  CO2 21  BUN 12  CREATININE 0.96  CALCIUM 8.7  PROT 6.5  BILITOT <0.2*  ALKPHOS 57  ALT  34  AST 19  GLUCOSE 150*   No results found for this basename: PTT   No results found for this basename: INR,  PROTIME   No results found for this basename: CKTOTAL,  CKMB,  CKMBINDEX,  TROPONINI     No results found for this basename: CHOL   No results found for this basename: HDL   No results found for this basename: LDLCALC   No results found for this basename: TRIG   No results found for this basename: CHOLHDL   No results found for this basename: LDLDIRECT      Radiology:  Dg Chest Portable 1 View  08/30/2014   CLINICAL DATA:  Short of breath  EXAM: PORTABLE CHEST - 1 VIEW  COMPARISON:  08/28/2014  FINDINGS: Decreased lung volume with mild atelectasis in the right lung base. Negative for heart failure or pneumonia.  IMPRESSION: Mild hypoventilation with right lower lobe atelectasis.   Electronically Signed   By: Marlan Palauharles  Clark M.D.   On: 08/30/2014 16:17    EKG:  NSR with LVH and T wave inversions in V3 and V4  ASSESSMENT:  1.  Atypical chest pain.  He has 2 different types of pain.  The first started a few days ago with his cough and is pleuritic and occurs only with deep breathing and cough.  This is consistent with musculoskeletal etiology.  His second pain started yesterday and is a burning pain which radiates from his LUQ of his abdomen up into his throat along with a sour taste in his mouth and is completely resolved temporarily with GI cocktail.  I suspect he may have some GERD from recent steroid use.  His symptoms do not sound cardiac and he has no CRF except for smoking.  He does, though, have an abnormal EKG with LVH and T wave inversions in V3 and V4.  He has no history of HTN in the past but does not see a PCP. 2.  Abnormal EKG 3.  Acute Respiratory Failure secondary to probable CAP and asthma exacerbation   PLAN:   1.  Cycle cardiac enzymes 2.  Protonix 40mg  BID 3.  2D echo to assess LVF and for LVH 4.  No anticoagulation at this time unless enzymes become  positive 5.  Check D-Dimer to rule out PE given pleuritic component  Will follow with you  Quintella ReichertURNER,Fausto Sampedro R, MD  08/31/2014  9:29 AM

## 2014-08-31 NOTE — Progress Notes (Signed)
Pt arrive to unit in no s/s of distress. Pt A&Ox4. Pt oriented to room and unit. Girlfriend at bedside. VS stable. Report received by charge nurse from ED nurse prior to pt's arrival. Whiteboard updated. Callbell within reach.

## 2014-08-31 NOTE — Progress Notes (Signed)
UR completed 

## 2014-08-31 NOTE — Progress Notes (Signed)
On call physician notified for pt's request of heart burn medicine and headache medicine.

## 2014-08-31 NOTE — Progress Notes (Signed)
Echocardiogram 2D Echocardiogram has been performed.  Jose Diaz 08/31/2014, 4:18 PM

## 2014-08-31 NOTE — Progress Notes (Addendum)
PROGRESS NOTE  Jose Diaz ZOX:[id number] DOB: 20-Jun-1986 DOA: 08/30/2014 PCP: No PCP Per Patient  HPI/Subjective: Jose Diaz is a 28 year old homeless male with history of asthma and tobacco smoking who presented to the ED 10/6 with acute respiratory failure with hypoxia. He was seen 10/4 with similar symptoms, was intended to be admitted, but eloped due to family emergency.   Today his breathing has improved, he continues to experience a cough, and is experiencing a burning chest pain that radiates to his throat. He also states that he vomits each time he eats. He reports experiencing heartburn at home, and typically takes  OTC medication his girlfriend brings him. He currently lives in his car with his girlfriend and reports he is unable to afford the medications prescribed from the ED on last visit.   Assessment/Plan:  Acute resp failure  -Secondary to asthma exacerbation  - Better with steroids, antibiotics.  Asthma exacerbation - Improved compared to on admission. Stop Solu-Medrol, changed to prednisone. -CXR reveals Atelectasis and peribronchial thickening consistent with history of asthma--Empirically treated with rocephin and azithro for CAP coverage due to increased susceptibility for infection  Resp alkalosis/metabolic acidosis  -Mixed disorder present on arrival to ED -Likely multifactorial with contributions from recurrent increased WOB and post tussive emesis  -Lactate WNL and ETOH negative in ED -Anion gap 16 on 10/7  Chest Pain -Likely can be contributed to GERD based on quality and location, and history of heart burn -Patient given GI cocktail, which temporarily relieved symptoms. Protonix ordered. -EKG shows normal sinus rhythm with LVH and T wave inversions in V3 and V4. -DDimer normal , Troponin in ED was 0.01. Repeat troponin WNL -Cardiology consulted, ordered 2D ECHO for LVH and T wave inversions found on EKG. Suspect GERD as well as musculoskeletal  due to coughing.  DVT Prophylaxis:  heparin  Code Status: Full Family Communication: patient alert and oriented Disposition Plan: remain inpatient for now.  Likely d/c 10/8.  Consultants:  Cardiology  Procedures:  None  Antibiotics: Anti-infectives   Start     Dose/Rate Route Frequency Ordered Stop   08/30/14 1915  cefTRIAXone (ROCEPHIN) 1 g in dextrose 5 % 50 mL IVPB     1 g 100 mL/hr over 30 Minutes Intravenous Every 24 hours 08/30/14 1907 09/06/14 1914   08/30/14 1915  azithromycin (ZITHROMAX) 500 mg in dextrose 5 % 250 mL IVPB     500 mg 250 mL/hr over 60 Minutes Intravenous Every 24 hours 08/30/14 1907 09/06/14 1914       Objective: Filed Vitals:   08/30/14 2232 08/30/14 2316 08/31/14 0557 08/31/14 1305  BP: 151/98  146/88   Pulse: 119  97   Temp: 98.7 F (37.1 C)  98.3 F (36.8 C)   TempSrc: Oral  Oral   Resp: 23  20   Height: 6" (0.152 m)     Weight: 111.585 kg (246 lb)     SpO2: 99% 98% 93% 99%    Intake/Output Summary (Last 24 hours) at 08/31/14 1307 Last data filed at 08/31/14 1032  Gross per 24 hour  Intake   1260 ml  Output    400 ml  Net    860 ml   Filed Weights   08/30/14 2232  Weight: 111.585 kg (246 lb)    Exam: General: Well developed, well nourished male in mild distress, spitting in bucket upon arrival, appears stated age, positive gynecomastia Cardiovascular: RRR, S1 S2 auscultated, no clicks, rubs, murmurs or gallops.  Respiratory: Clear to auscultation bilaterally with equal chest rise, no wheezes, rales or rhonchi Abdomen:  Soft, nt, nd, +bs, no masses. Neuro: AAOx3, cranial nerves grossly intact.  Extremities:  Able to move all 4.  5/5 strength in each. Psych: Normal affect and demeanor with intact judgement and insight   Data Reviewed: Basic Metabolic Panel:  Recent Labs Lab 08/28/14 2304 08/30/14 1517 08/30/14 1905 08/31/14 0234  NA 139 141  --  140  K 3.0* 4.1  --  4.0  CL 101 105  --  103  CO2 22 15*  --  21   GLUCOSE 111* 133*  --  150*  BUN 9 12  --  12  CREATININE 1.10 0.95 0.94 0.96  CALCIUM 9.1 9.3  --  8.7   Liver Function Tests:  Recent Labs Lab 08/31/14 0234  AST 19  ALT 34  ALKPHOS 57  BILITOT <0.2*  PROT 6.5  ALBUMIN 3.3*   CBC:  Recent Labs Lab 08/28/14 2304 08/30/14 1517 08/30/14 1905 08/31/14 0234  WBC 10.5 13.0* 14.0* 13.6*  NEUTROABS 7.3  --   --  12.3*  HGB 15.1 14.5 13.0 12.7*  HCT 43.8 42.0 37.4* 36.6*  MCV 84.7 86.6 85.6 85.5  PLT 287 344 326 327     Recent Results (from the past 240 hour(s))  CULTURE, EXPECTORATED SPUTUM-ASSESSMENT     Status: None   Collection Time    08/30/14 11:30 PM      Result Value Ref Range Status   Specimen Description SPUTUM   Final   Special Requests NONE   Final   Sputum evaluation     Final   Value: MICROSCOPIC FINDINGS SUGGEST THAT THIS SPECIMEN IS NOT REPRESENTATIVE OF LOWER RESPIRATORY SECRETIONS. PLEASE RECOLLECT.     CALLED TO J.WOOSLEY,RN 0001 08/31/14 M.CAMPBELL   Report Status 08/31/2014 FINAL   Final     Studies: Dg Chest Portable 1 View  08/30/2014   CLINICAL DATA:  Short of breath  EXAM: PORTABLE CHEST - 1 VIEW  COMPARISON:  08/28/2014  FINDINGS: Decreased lung volume with mild atelectasis in the right lung base. Negative for heart failure or pneumonia.  IMPRESSION: Mild hypoventilation with right lower lobe atelectasis.   Electronically Signed   By: Marlan Palau M.D.   On: 08/30/2014 16:17    Scheduled Meds: . aspirin  325 mg Oral Daily  . azithromycin  500 mg Intravenous Q24H  . cefTRIAXone (ROCEPHIN)  IV  1 g Intravenous Q24H  . heparin  5,000 Units Subcutaneous 3 times per day  . [START ON 09/01/2014] Influenza vac split quadrivalent PF  0.5 mL Intramuscular Tomorrow-1000  . ipratropium-albuterol  3 mL Nebulization QID  . [START ON 09/01/2014] methylPREDNISolone (SOLU-MEDROL) injection  40 mg Intravenous Daily  . pantoprazole  40 mg Oral QHS  . [START ON 09/01/2014] pneumococcal 23 valent vaccine   0.5 mL Intramuscular Tomorrow-1000   Continuous Infusions: . sodium chloride 100 mL/hr at 08/31/14 1006    Principal Problem:   Asthma with acute exacerbation Active Problems:   Acute respiratory failure   Tobacco abuse   Respiratory alkalosis   Metabolic acidosis   CAP (community acquired pneumonia)   Chest pain   Abnormal EKG  Elvis Coil, PA-S2 Algis Downs, PA-C  [facility] Pager [phone]. If 7PM-7AM, please contact night-coverage at [email or web address], password [facility] 08/31/2014, 1:07 PM  LOS: 1 day   Attending Seen and examined , agree with above. Admitted with asthma exacerbation and acute hypoxic respiratory failure. Significantly  improved with steroids and antibiotics along with nebulized bronchodilators. Developed chest pain this morning, which is likely anxiety or reflux. Suspect that his clinical improvement continues, home tomorrow morning.  Windell NorfolkS Ghimire MD

## 2014-09-01 LAB — COMPREHENSIVE METABOLIC PANEL
ALK PHOS: 55 U/L (ref 39–117)
ALT: 34 U/L (ref 0–53)
AST: 12 U/L (ref 0–37)
Albumin: 3.1 g/dL — ABNORMAL LOW (ref 3.5–5.2)
Anion gap: 11 (ref 5–15)
BUN: 21 mg/dL (ref 6–23)
CO2: 23 meq/L (ref 19–32)
Calcium: 8.6 mg/dL (ref 8.4–10.5)
Chloride: 106 mEq/L (ref 96–112)
Creatinine, Ser: 1.12 mg/dL (ref 0.50–1.35)
GFR, EST NON AFRICAN AMERICAN: 89 mL/min — AB (ref >90)
GLUCOSE: 95 mg/dL (ref 70–99)
POTASSIUM: 4 meq/L (ref 3.7–5.3)
SODIUM: 140 meq/L (ref 137–147)
Total Bilirubin: 0.2 mg/dL — ABNORMAL LOW (ref 0.3–1.2)
Total Protein: 6 g/dL (ref 6.0–8.3)

## 2014-09-01 LAB — CBC WITH DIFFERENTIAL/PLATELET
BASOS PCT: 0 % (ref 0–1)
Basophils Absolute: 0 10*3/uL (ref 0.0–0.1)
EOS ABS: 0.1 10*3/uL (ref 0.0–0.7)
Eosinophils Relative: 0 % (ref 0–5)
HCT: 39.4 % (ref 39.0–52.0)
Hemoglobin: 13.4 g/dL (ref 13.0–17.0)
Lymphocytes Relative: 22 % (ref 12–46)
Lymphs Abs: 3.5 10*3/uL (ref 0.7–4.0)
MCH: 29.6 pg (ref 26.0–34.0)
MCHC: 34 g/dL (ref 30.0–36.0)
MCV: 87.2 fL (ref 78.0–100.0)
Monocytes Absolute: 0.9 10*3/uL (ref 0.1–1.0)
Monocytes Relative: 5 % (ref 3–12)
NEUTROS PCT: 73 % (ref 43–77)
Neutro Abs: 11.5 10*3/uL — ABNORMAL HIGH (ref 1.7–7.7)
PLATELETS: 333 10*3/uL (ref 150–400)
RBC: 4.52 MIL/uL (ref 4.22–5.81)
RDW: 13.7 % (ref 11.5–15.5)
WBC: 16 10*3/uL — ABNORMAL HIGH (ref 4.0–10.5)

## 2014-09-01 LAB — LEGIONELLA ANTIGEN, URINE

## 2014-09-01 MED ORDER — BUDESONIDE-FORMOTEROL FUMARATE 80-4.5 MCG/ACT IN AERO
2.0000 | INHALATION_SPRAY | Freq: Two times a day (BID) | RESPIRATORY_TRACT | Status: DC
  Filled 2014-09-01: qty 6.9

## 2014-09-01 MED ORDER — BUDESONIDE-FORMOTEROL FUMARATE 160-4.5 MCG/ACT IN AERO
2.0000 | INHALATION_SPRAY | Freq: Two times a day (BID) | RESPIRATORY_TRACT | Status: DC
  Administered 2014-09-01 – 2014-09-02 (×3): 2 via RESPIRATORY_TRACT
  Filled 2014-09-01: qty 6

## 2014-09-01 MED ORDER — GUAIFENESIN ER 600 MG PO TB12
600.0000 mg | ORAL_TABLET | Freq: Two times a day (BID) | ORAL | Status: DC
  Administered 2014-09-01 – 2014-09-02 (×3): 600 mg via ORAL
  Filled 2014-09-01 (×4): qty 1

## 2014-09-01 MED ORDER — OXYMETAZOLINE HCL 0.05 % NA SOLN
1.0000 | Freq: Two times a day (BID) | NASAL | Status: DC
  Administered 2014-09-01 – 2014-09-02 (×3): 1 via NASAL
  Filled 2014-09-01: qty 15

## 2014-09-01 MED ORDER — LORATADINE 10 MG PO TABS
10.0000 mg | ORAL_TABLET | Freq: Every day | ORAL | Status: DC
  Administered 2014-09-01 – 2014-09-02 (×2): 10 mg via ORAL
  Filled 2014-09-01 (×2): qty 1

## 2014-09-01 MED ORDER — METHYLPREDNISOLONE SODIUM SUCC 125 MG IJ SOLR
60.0000 mg | Freq: Three times a day (TID) | INTRAMUSCULAR | Status: DC
  Administered 2014-09-01: 60 mg via INTRAVENOUS
  Filled 2014-09-01 (×4): qty 0.96

## 2014-09-01 MED ORDER — FLUTICASONE PROPIONATE 50 MCG/ACT NA SUSP
1.0000 | Freq: Every day | NASAL | Status: DC
  Administered 2014-09-01 – 2014-09-02 (×2): 1 via NASAL
  Filled 2014-09-01: qty 16

## 2014-09-01 MED ORDER — PREDNISONE 20 MG PO TABS
40.0000 mg | ORAL_TABLET | Freq: Every day | ORAL | Status: DC
  Administered 2014-09-02: 40 mg via ORAL
  Filled 2014-09-01 (×2): qty 2

## 2014-09-01 NOTE — Progress Notes (Signed)
Patient at rest on room air spO2 97%. While ambulating patient maintained spO2 97% while on room air.

## 2014-09-01 NOTE — Progress Notes (Addendum)
PROGRESS NOTE  Jose Diaz JYN:[id number] DOB: Apr 19, 1986 DOA: 08/30/2014 PCP: No PCP Per Patient  HPI/Subjective: Jose Diaz is a 62 year homeless male with history of asthma and cigarette smoking who presented with SOB and cough and was subsequently admitted with acute respiratory failure with hypoxia.  Today he continues to complain of chest tightness, and pleuritic chest pain. After the tussinex and Remus Loffler, he reports sleeping better than he has in a long while. His reflux has significantly improved and he is no longer experiencing the burning chest pain from yesterday.  Assessment/Plan:  Asthma exacerbation  - Improved compared to on admission, however patient still experiencing chest tightness and pleuritic chest pain resulting in more shallow breathing, incentive spirometry ordered. -CXR reveals Atelectasis and peribronchial thickening consistent with history of asthma--Empirically treated with rocephin and azithro for CAP coverage due to increased susceptibility for infection  -With ambulation, patient's oxygen saturation remained stable at or above 97%, however, he became tachycardic with a heart rate around 120.  -IV Solu-medrol re-initiated, continue antibiotics, nebs.  Added symbicort, flonase, afrin, claritin.  Resp alkalosis/metabolic acidosis  -Resolved. -Mixed disorder present on arrival to ED  -Likely multifactorial with contributions from recurrent increased WOB and post tussive emesis  -Lactate WNL and ETOH negative in ED  -Anion gap 11 on 10/8  Chest Pain  -Symptoms occurred 2x.  Notably each time was after receiving solumedrol. -Suspect CP worsened by Anxiety (patient recently lost a child and is homeless) -2 types, a burning pain, and pleuritic pain. Burning sensation likely can be contributed to GERD based on quality and location, and history of heart burn. Pleuritic pain probably secondary to coughing.  -Patient given GI cocktail, which temporarily  relieved symptoms. Protonix ordered.    -EKG shows normal sinus rhythm with LVH and T wave inversions in V3 and V4.  -DDimer normal , Troponin in ED was 0.01. Repeat troponin WNL  -ECHO reveals minimal LVH with normal EF.   DVT Prophylaxis:  Heparin  Code Status: Full Family Communication:Girlfriend at bedside Disposition Plan: remain inpatient at this time   Consultants:  Cardiology  Case Management  Procedures:  None  Antibiotics: Anti-infectives   Start     Dose/Rate Route Frequency Ordered Stop   08/30/14 1915  cefTRIAXone (ROCEPHIN) 1 g in dextrose 5 % 50 mL IVPB     1 g 100 mL/hr over 30 Minutes Intravenous Every 24 hours 08/30/14 1907 09/06/14 1914   08/30/14 1915  azithromycin (ZITHROMAX) 500 mg in dextrose 5 % 250 mL IVPB     500 mg 250 mL/hr over 60 Minutes Intravenous Every 24 hours 08/30/14 1907 09/06/14 1914       Objective: Filed Vitals:   09/01/14 1010 09/01/14 1011 09/01/14 1220 09/01/14 1338  BP:    144/89  Pulse:    104  Temp:    98.1 F (36.7 C)  TempSrc:    Oral  Resp:    20  Height:      Weight:      SpO2: 97% 96% 97% 96%    Intake/Output Summary (Last 24 hours) at 09/01/14 1353 Last data filed at 09/01/14 1011  Gross per 24 hour  Intake    240 ml  Output      0 ml  Net    240 ml   Filed Weights   08/30/14 2232  Weight: 111.585 kg (246 lb)    Exam: General: Well developed, well nourished male sitting in bed in NAD, appears stated age  Cardiovascular: RRR, normal S1 S2, no rubs, clicks, murmurs or gallops.   Respiratory: Expiratory wheezes noted bilaterally. No rales or rhonchi. Patient reluctant to take deep breath secondary to pain. Abdomen: Soft, nontender, nondistended, + bowel sounds   Neuro: AAOx3, cranial nerves grossly intact.  Psych: Normal affect and demeanor, intact judgement and insight   Data Reviewed: Basic Metabolic Panel:  Recent Labs Lab 08/28/14 2304 08/30/14 1517 08/30/14 1905 08/31/14 0234  09/01/14 0525  NA 139 141  --  140 140  K 3.0* 4.1  --  4.0 4.0  CL 101 105  --  103 106  CO2 22 15*  --  21 23  GLUCOSE 111* 133*  --  150* 95  BUN 9 12  --  12 21  CREATININE 1.10 0.95 0.94 0.96 1.12  CALCIUM 9.1 9.3  --  8.7 8.6   Liver Function Tests:  Recent Labs Lab 08/31/14 0234 09/01/14 0525  AST 19 12  ALT 34 34  ALKPHOS 57 55  BILITOT <0.2* 0.2*  PROT 6.5 6.0  ALBUMIN 3.3* 3.1*  CBC:  Recent Labs Lab 08/28/14 2304 08/30/14 1517 08/30/14 1905 08/31/14 0234 09/01/14 0525  WBC 10.5 13.0* 14.0* 13.6* 16.0*  NEUTROABS 7.3  --   --  12.3* 11.5*  HGB 15.1 14.5 13.0 12.7* 13.4  HCT 43.8 42.0 37.4* 36.6* 39.4  MCV 84.7 86.6 85.6 85.5 87.2  PLT 287 344 326 327 333   Cardiac Enzymes:  Recent Labs Lab 08/31/14 1057 08/31/14 1534 08/31/14 2122  TROPONINI <0.30 <0.30 <0.30     Recent Results (from the past 240 hour(s))  CULTURE, BLOOD (ROUTINE X 2)     Status: None   Collection Time    08/30/14 10:19 PM      Result Value Ref Range Status   Specimen Description BLOOD RIGHT ARM   Final   Special Requests BOTTLES DRAWN AEROBIC AND ANAEROBIC 8CC   Final   Culture  Setup Time     Final   Value: 08/31/2014 07:03     Performed at [facility]   Culture     Final   Value:        BLOOD CULTURE RECEIVED NO GROWTH TO DATE CULTURE WILL BE HELD FOR 5 DAYS BEFORE ISSUING A FINAL NEGATIVE REPORT     Performed at [facility]   Report Status PENDING   Incomplete  CULTURE, BLOOD (ROUTINE X 2)     Status: None   Collection Time    08/30/14 10:31 PM      Result Value Ref Range Status   Specimen Description BLOOD LEFT HAND   Final   Special Requests BOTTLES DRAWN AEROBIC ONLY 3CC   Final   Culture  Setup Time     Final   Value: 08/31/2014 01:55     Performed at [facility]   Culture     Final   Value:        BLOOD CULTURE RECEIVED NO GROWTH TO DATE CULTURE WILL BE HELD FOR 5 DAYS BEFORE ISSUING A FINAL NEGATIVE REPORT     Performed at  [facility]   Report Status PENDING   Incomplete  CULTURE, EXPECTORATED SPUTUM-ASSESSMENT     Status: None   Collection Time    08/30/14 11:30 PM      Result Value Ref Range Status   Specimen Description SPUTUM   Final   Special Requests NONE   Final   Sputum evaluation  Final   Value: MICROSCOPIC FINDINGS SUGGEST THAT THIS SPECIMEN IS NOT REPRESENTATIVE OF LOWER RESPIRATORY SECRETIONS. PLEASE RECOLLECT.     CALLED TO J.WOOSLEY,RN 0001 08/31/14 M.CAMPBELL   Report Status 08/31/2014 FINAL   Final     Studies: Dg Chest Portable 1 View  08/30/2014   CLINICAL DATA:  Short of breath  EXAM: PORTABLE CHEST - 1 VIEW  COMPARISON:  08/28/2014  FINDINGS: Decreased lung volume with mild atelectasis in the right lung base. Negative for heart failure or pneumonia.  IMPRESSION: Mild hypoventilation with right lower lobe atelectasis.   Electronically Signed   By: Marlan Palau M.D.   On: 08/30/2014 16:17    Scheduled Meds: . aspirin  325 mg Oral Daily  . azithromycin  500 mg Intravenous Q24H  . budesonide-formoterol  2 puff Inhalation BID  . cefTRIAXone (ROCEPHIN)  IV  1 g Intravenous Q24H  . fluticasone  1 spray Each Nare Daily  . heparin  5,000 Units Subcutaneous 3 times per day  . ipratropium-albuterol  3 mL Nebulization QID  . loratadine  10 mg Oral Daily  . oxymetazoline  1 spray Each Nare BID  . pantoprazole  40 mg Oral QHS  . [START ON 09/02/2014] predniSONE  40 mg Oral Q breakfast   Continuous Infusions:   Principal Problem:   Asthma with acute exacerbation Active Problems:   Acute respiratory failure   Tobacco abuse   Respiratory alkalosis   Metabolic acidosis   CAP (community acquired pneumonia)   Chest pain   Abnormal EKG   Renee Ramus Romualdo Bolk [facility] Pager [phone]. If 7PM-7AM, please contact night-coverage at [email or web address], password [facility] 09/01/2014, 1:53 PM  LOS: 2 days   Attending Patient was seen, examined,treatment plan  was discussed with the  Advance Practice Provider.  I have directly reviewed the clinical findings, lab, imaging studies and management of this patient in detail. I have made the necessary changes to the above noted documentation, and agree with the documentation, as recorded by the Advance Practice Provider.   Slowly improving, good air entry on exam-still with some wheezing. Suspect requires additional one more day of hospitalization. Claims to have nasal congestion-post nasal drip may be contributing to ongoing bronchospasm-start Flonase/antihistamines/antihistamine  Windell Norfolk MD [facility].

## 2014-09-01 NOTE — Progress Notes (Signed)
Patient Name: Jose Diaz Date of Encounter: 09/01/2014  Principal Problem:   Asthma with acute exacerbation Active Problems:   Acute respiratory failure   Tobacco abuse   Respiratory alkalosis   Metabolic acidosis   CAP (community acquired pneumonia)   Chest pain   Abnormal EKG   Length of Stay: 2  SUBJECTIVE  Diaphoretic, feels cold, shaking chills  Was seen at [facility] in 2012 with chest pain and abnormal ECG, followed by normal cardiac MRI - cannot pull up the actual tracings, but description sounds similar to today:  "ED Course: EKG#1: TWI in lead 3 and 4 EKG#2: 1mm ST elevation and upright T waves in V3 and V4 EKG#3: similar apperance EKG#4: TWI in leads 3 and 4 again Cardiology Dr Donalee Citrin evaluated pt, recommended r/o for HOCM, recommending cardiac MRI w/o Adenosine Cardiac markers: neg x 3 other labs unremarkable, Utox - negative"    CURRENT MEDS . aspirin  325 mg Oral Daily  . azithromycin  500 mg Intravenous Q24H  . budesonide-formoterol  2 puff Inhalation BID  . cefTRIAXone (ROCEPHIN)  IV  1 g Intravenous Q24H  . heparin  5,000 Units Subcutaneous 3 times per day  . ipratropium-albuterol  3 mL Nebulization QID  . methylPREDNISolone (SOLU-MEDROL) injection  60 mg Intravenous 3 times per day  . pantoprazole  40 mg Oral QHS    OBJECTIVE   Intake/Output Summary (Last 24 hours) at 09/01/14 1209 Last data filed at 09/01/14 1011  Gross per 24 hour  Intake    240 ml  Output      0 ml  Net    240 ml   Filed Weights   08/30/14 2232  Weight: 111.585 kg (246 lb)    PHYSICAL EXAM Filed Vitals:   09/01/14 0551 09/01/14 0742 09/01/14 1010 09/01/14 1011  BP: 113/66     Pulse: 90     Temp: 98.2 F (36.8 C)     TempSrc: Oral     Resp: 18     Height:      Weight:      SpO2: 95% 98% 97% 96%   General: Alert, oriented x3, no distress Head: no evidence of trauma, PERRL, EOMI, no exophtalmos or lid lag, no myxedema, no xanthelasma; normal ears, nose and  oropharynx Neck: normal jugular venous pulsations and no hepatojugular reflux; brisk carotid pulses without delay and no carotid bruits Chest: clear to auscultation, no signs of consolidation by percussion or palpation, normal fremitus, symmetrical and full respiratory excursions Cardiovascular: normal position and quality of the apical impulse, regular rhythm, normal first and second heart sounds, no rubs or gallops, no murmur Abdomen: no tenderness or distention, no masses by palpation, no abnormal pulsatility or arterial bruits, normal bowel sounds, no hepatosplenomegaly Extremities: no clubbing, cyanosis or edema; 2+ radial, ulnar and brachial pulses bilaterally; 2+ right femoral, posterior tibial and dorsalis pedis pulses; 2+ left femoral, posterior tibial and dorsalis pedis pulses; no subclavian or femoral bruits Neurological: grossly nonfocal  LABS  CBC  Recent Labs  08/31/14 0234 09/01/14 0525  WBC 13.6* 16.0*  NEUTROABS 12.3* 11.5*  HGB 12.7* 13.4  HCT 36.6* 39.4  MCV 85.5 87.2  PLT 327 333   Basic Metabolic Panel  Recent Labs  08/31/14 0234 09/01/14 0525  NA 140 140  K 4.0 4.0  CL 103 106  CO2 21 23  GLUCOSE 150* 95  BUN 12 21  CREATININE 0.96 1.12  CALCIUM 8.7 8.6   Liver Function Tests  Recent Labs  08/31/14 0234 09/01/14 0525  AST 19 12  ALT 34 34  ALKPHOS 57 55  BILITOT <0.2* 0.2*  PROT 6.5 6.0  ALBUMIN 3.3* 3.1*   No results found for this basename: LIPASE, AMYLASE,  in the last 72 hours Cardiac Enzymes  Recent Labs  08/31/14 1057 08/31/14 1534 08/31/14 2122  TROPONINI <0.30 <0.30 <0.30   BNP No components found with this basename: POCBNP,  D-Dimer  Recent Labs  08/31/14 1057  DDIMER <0.27  Radiology Studies Imaging results have been reviewed and Dg Chest Portable 1 View  08/30/2014   CLINICAL DATA:  Short of breath  EXAM: PORTABLE CHEST - 1 VIEW  COMPARISON:  08/28/2014  FINDINGS: Decreased lung volume with mild atelectasis in the  right lung base. Negative for heart failure or pneumonia.  IMPRESSION: Mild hypoventilation with right lower lobe atelectasis.   Electronically Signed   By: Marlan Palauharles  Clark M.D.   On: 08/30/2014 16:17    TELE NSR  ECG NSR, Probable LVH with secondary repol changes  ECHO Confirms mild concentric LVH, no wall motion abnormality, no SAM of the mitral valve or LV obstruction  [facility] Patient Name: Jose Diaz, Jose Diaz Patient MRN: [id number] CMR Report Patient DOB: 1985-12-28 Referring MD: Coral Ceoichard Becker, MD Finalized Scan Date: 12/27/2010 Approved and Dr Henderson Cloudanielle Seaman, Signed by: M.D. ([id number]) Signature 12/27/2010 20:42:00 Date: SUMMARY  Cardiac MRI on a 1.5T scanner was performed to assess morphology, function and viability in a 28 year old man with chest pain and dynamic ekg changes. Chest MRA without contrast was performed to rule out dissection or aneurysm.  1. The left ventricle is normal in size, thickness, and systolic function. There are no regional wall motion abnormalities. The left ventricular ejection fraction is visually estimated at 65%. 2. The right ventricle is normal in size, thickness, and systolic function. 3. There is no significant valvular disease. 4. Delayed enhancement is normal. There is no evidence of myocardial infarction, scar, or infiltrative process. 5. The right coronary artery arises from the right coronary cusp and courses in a normal pattern through the right AV groove. The left main coronary artery arises from the left coronary cusp and, after a short left main artery, branches into the left anterior decending artery and left circumflex artery, which course through in their normal patterns through the anterior interventricular groove and the left AV groove, Respectively.  Chest MRA without contrast:  1. The aorta is normal in caliber. 2. The great vessels arise in a normal anatomic order. 3. There  is no evidence of an aortic dissection. 4. The pulmonary artery is normal in caliber    ASSESSMENT AND PLAN  Abnormal ECG is not a new finding and is likely an incidental abnormality, not related to his current acute illness. No evidence of HOCM, very low suspicion for CAD.  No personal or family history of unexplained syncope, arrhythmia, cardiac arrest, CHF or premature CAD. No plan for additional cardiac workup at this time. Thank you.   Thurmon FairMihai Jamareon Shimel, MD, [facility] [phone] office [phone] pager 09/01/2014 12:09 PM

## 2014-09-01 NOTE — Care Management Note (Signed)
    Page 1 of 1   09/01/2014     3:25:49 PM CARE MANAGEMENT NOTE 09/01/2014  Patient:  Jose Diaz,Jose Diaz   Account Number:  [id number]  Date Initiated:  09/01/2014  Documentation initiated by:  Letha CapeAYLOR,Dameir Gentzler  Subjective/Objective Assessment:   dx sob, chest pain  admit- Homeless     Action/Plan:   Anticipated DC Date:  09/02/2014   Anticipated DC Plan:  HOME/SELF CARE      DC Planning Services  CM consult  Follow-up appt scheduled  Medication Assistance      Choice offered to / List presented to:             Status of service:  In process, will continue to follow Medicare Important Message given?  NO (If response is "NO", the following Medicare IM given date fields will be blank) Date Medicare IM given:   Medicare IM given by:   Date Additional Medicare IM given:   Additional Medicare IM given by:    Discharge Disposition:    Per UR Regulation:  Reviewed for med. necessity/level of care/duration of stay  If discussed at Long Length of Stay Meetings, dates discussed:    Comments:  09/01/14 1516 Letha Capeeborah Marleah Beever RN, BSN [phone] NCM gave patient [facility] information for [facility]u apt.  He will need to go there to get his meds at dc.

## 2014-09-02 MED ORDER — ALBUTEROL SULFATE HFA 108 (90 BASE) MCG/ACT IN AERS: 2.0000 | INHALATION_SPRAY | RESPIRATORY_TRACT | Status: AC | PRN

## 2014-09-02 MED ORDER — HYDROCOD POLST-CHLORPHEN POLST 10-8 MG/5ML PO LQCR: 5.0000 mL | Freq: Every evening | ORAL | Status: AC | PRN

## 2014-09-02 MED ORDER — LORATADINE 10 MG PO TABS: 10.0000 mg | ORAL_TABLET | Freq: Every day | ORAL | Status: AC

## 2014-09-02 MED ORDER — BUDESONIDE-FORMOTEROL FUMARATE 160-4.5 MCG/ACT IN AERO: 2.0000 | INHALATION_SPRAY | Freq: Two times a day (BID) | RESPIRATORY_TRACT | Status: AC

## 2014-09-02 MED ORDER — NICOTINE 14 MG/24HR TD PT24: 14.0000 mg | MEDICATED_PATCH | Freq: Every day | TRANSDERMAL | Status: AC

## 2014-09-02 MED ORDER — OMEPRAZOLE 40 MG PO CPDR: 40.0000 mg | DELAYED_RELEASE_CAPSULE | Freq: Every day | ORAL | Status: DC

## 2014-09-02 MED ORDER — FLUTICASONE PROPIONATE 50 MCG/ACT NA SUSP: 1.0000 | Freq: Every day | NASAL | Status: AC

## 2014-09-02 MED ORDER — PREDNISONE 20 MG PO TABS: ORAL_TABLET | ORAL | Status: AC

## 2014-09-02 NOTE — ED Provider Notes (Signed)
Medical screening examination/treatment/procedure(s) were performed by non-physician practitioner and as supervising physician I was immediately available for consultation/collaboration.   EKG Interpretation None       Docia Klar, MD 09/02/14 0102 

## 2014-09-02 NOTE — Discharge Summary (Signed)
Jose Diaz RUE:[id number] DOB: [birth year] DOA: 08/30/2014  PCP: No PCP Per Patient  Admit date: 08/30/2014 Discharge date: 09/02/2014  Time spent: 45 minutes  Recommendations for Outpatient Follow-up:  1. Recheck CBC. Leukocytosis inpatient probable secondary to steroid use.  2. Assess asthma severity in the state of non-exacerbation, and determine attainable future asthma management. 3. Homeless 4. Tobacco cessation counseled.   Discharge Diagnoses:  Principal Problem:   Asthma with acute exacerbation Active Problems:   Acute respiratory failure   Tobacco abuse   Respiratory alkalosis   Metabolic acidosis   CAP (community acquired pneumonia)   Chest pain   Abnormal EKG   Discharge Condition: stable  Diet recommendation: Heart Healthy   History of present illness:  Jose Diaz is a homeless 28 year old male with a history of asthma and cigarette smoking who presented to the ED 10/6 with acute respiratory failure and hypoxia secondary to asthma exacerbation. He was seen in the ED 10/4 for same, with the intention of admission, but patient eloped due to family emergency. When he returned with worsened symptoms, ABG revealed mixed respiratory alkalosis and metabolic acidosis and CXR showed hypoventilation and RLL atelectasis. While inpatient, he reported chest pain, which was found to be related to GERD and probable musculoskeletal secondary to cough. EKG with some abnormalities, but after review of EKG, and other Cardiac studies from [facility], it was determined these changes were not new in nature. ECHO performed at [facility] was also normal.   On day of discharge the patient is stable and reports breathing has improved. He is no longer complaining of chest pain, and has no other complaints. Patient and girlfriend are currently living in their car, but plan on staying with girlfriend's aunt after discharge.  Hospital Course:   Asthma exacerbation  - Improved drastically.  -CXR  from ED reveals Atelectasis and peribronchial thickening consistent with history of asthma--Empirically treated with rocephin and azithro for CAP coverage.  Antibiotics discontinued at discharge. -Continue albuterol, symbicort, flonase, afrin, claritin on discharge. Also discharged with prednisone taper.   Resp alkalosis/metabolic acidosis  -Resolved.  -Mixed disorder present on arrival to ED  -Likely multifactorial with contributions from recurrent increased WOB and post tussive emesis   Chest Pain  -Symptoms occurred 2x. Notably each time was after receiving solumedrol.  -Suspect CP worsened by Anxiety (patient recently lost a child and is homeless)  -2 types, a burning pain, and pleuritic pain. Burning sensation likely can be contributed to GERD based on quality and location, and history of heart burn. Pleuritic pain probably secondary to coughing.  -EKG shows normal sinus rhythm with LVH and T wave inversions in V3 and V4 (which are found to be consistent from EKG from 2012), DDimer normal , Troponin in ED was 0.01. Repeat troponin WNL, ECHO reveals minimal LVH with normal EF.  Patient was evaluated by cardiology.  2D echo was completed and results are below. -GI cocktail and Protonix relieved symptoms. Omeprazole ordered for discharge.   Leukocytosis -WBC 16.0 on 10/8 -Likely secondary to steroid use. -No overt signs of infection.  -Recheck as outpatient.  Tobacco abuse -Counseled. -D/C with nicotine patch RX.  Homelessness -Living in girlfriend's car. -Social work consulted.  Patient provided shelter resources and information for low income housing in [place].    Procedures:  ECHO (10/7) Study Conclusions  - Left ventricle: The cavity size was normal. There was mild concentric hypertrophy. Systolic function was normal. The estimated ejection fraction was in the range of 60%  to 65%. Wall motion was normal; there were no regional wall  motion abnormalities.   Consultations:  Cardiology  Case Management  Discharge Exam: Filed Vitals:   09/02/14 0513  BP: 129/77  Pulse: 86  Temp: 98.3 F (36.8 C)  Resp: 20    General: Well developed, well nourished male lying in bed in NAD, appears stated age  Cardiovascular: RRR, normal S1 S2, no rubs, clicks, murmurs or gallops.  Respiratory: CTA bilaterally, no wheezes, rales or rhonchi. Abdomen: obese, Soft, nontender, nondistended, + bowel sounds  Neuro: AAOx3, cranial nerves grossly intact.  Psych: Pleasant patient with normal affect and demeanor, intact judgement and insight Filed Weights   08/30/14 2232  Weight: 111.585 kg (246 lb)    Discharge Instructions       Medication List         albuterol 108 (90 BASE) MCG/ACT inhaler  Commonly known as:  PROVENTIL HFA;VENTOLIN HFA  Inhale 2 puffs into the lungs every 4 (four) hours as needed for wheezing or shortness of breath.     budesonide-formoterol 160-4.5 MCG/ACT inhaler  Commonly known as:  SYMBICORT  Inhale 2 puffs into the lungs 2 (two) times daily.     chlorpheniramine-HYDROcodone 10-8 MG/5ML Lqcr  Commonly known as:  TUSSIONEX  Take 5 mLs by mouth at bedtime as needed for cough.     fluticasone 50 MCG/ACT nasal spray  Commonly known as:  FLONASE  Place 1 spray into both nostrils daily.     loratadine 10 MG tablet  Commonly known as:  CLARITIN  Take 1 tablet (10 mg total) by mouth daily.     nicotine 14 mg/24hr patch  Commonly known as:  EQ NICOTINE  Place 1 patch (14 mg total) onto the skin daily.     omeprazole 40 MG capsule  Commonly known as:  PRILOSEC  Take 1 capsule (40 mg total) by mouth daily.     predniSONE 20 MG tablet  Commonly known as:  DELTASONE  Take 4 tablets on days 1-2, take 3 tabs on days 3-4, take 2 tabs on days 5-6, take on tab on days 7-8.       No Known Allergies Follow-up Information   Follow up with [facility]     On 09/07/2014.  (2:15  for hospital follow up )    Contact information:   [address] [place] [address] [phone]      The results of significant diagnostics from this hospitalization (including imaging, microbiology, ancillary and laboratory) are listed below for reference.    Significant Diagnostic Studies: Dg Chest 2 View  08/29/2014   CLINICAL DATA:  Acute onset of bilateral chest pain and shortness of breath. Current history of moderate asthma. Initial encounter.  EXAM: CHEST  2 VIEW  COMPARISON:  None.  FINDINGS: The lungs are well-aerated. Peribronchial thickening likely reflects the patient's history of asthma. There is no evidence of focal opacification, pleural effusion or pneumothorax.  The heart is normal in size; the mediastinal contour is within normal limits. No acute osseous abnormalities are seen.  IMPRESSION: 1. Peribronchial thickening likely reflects the patient's history of asthma. 2. Otherwise unremarkable chest radiograph.   Electronically Signed   By: Roanna Raider M.D.   On: 08/29/2014 00:22   Dg Chest Portable 1 View  08/30/2014   CLINICAL DATA:  Short of breath  EXAM: PORTABLE CHEST - 1 VIEW  COMPARISON:  08/28/2014  FINDINGS: Decreased lung volume with mild atelectasis in  the right lung base. Negative for heart failure or pneumonia.  IMPRESSION: Mild hypoventilation with right lower lobe atelectasis.   Electronically Signed   By: Marlan Palauharles  Clark M.D.   On: 08/30/2014 16:17    Microbiology: Recent Results (from the past 240 hour(s))  CULTURE, BLOOD (ROUTINE X 2)     Status: None   Collection Time    08/30/14 10:19 PM      Result Value Ref Range Status   Specimen Description BLOOD RIGHT ARM   Final   Special Requests BOTTLES DRAWN AEROBIC AND ANAEROBIC 8CC   Final   Culture  Setup Time     Final   Value: 08/31/2014 07:03     Performed at [facility]   Culture     Final   Value:        BLOOD CULTURE RECEIVED NO GROWTH TO DATE CULTURE WILL BE HELD FOR 5  DAYS BEFORE ISSUING A FINAL NEGATIVE REPORT     Performed at [facility]   Report Status PENDING   Incomplete  CULTURE, BLOOD (ROUTINE X 2)     Status: None   Collection Time    08/30/14 10:31 PM      Result Value Ref Range Status   Specimen Description BLOOD LEFT HAND   Final   Special Requests BOTTLES DRAWN AEROBIC ONLY 3CC   Final   Culture  Setup Time     Final   Value: 08/31/2014 01:55     Performed at [facility]   Culture     Final   Value:        BLOOD CULTURE RECEIVED NO GROWTH TO DATE CULTURE WILL BE HELD FOR 5 DAYS BEFORE ISSUING A FINAL NEGATIVE REPORT     Performed at [facility]   Report Status PENDING   Incomplete  CULTURE, EXPECTORATED SPUTUM-ASSESSMENT     Status: None   Collection Time    08/30/14 11:30 PM      Result Value Ref Range Status   Specimen Description SPUTUM   Final   Special Requests NONE   Final   Sputum evaluation     Final   Value: MICROSCOPIC FINDINGS SUGGEST THAT THIS SPECIMEN IS NOT REPRESENTATIVE OF LOWER RESPIRATORY SECRETIONS. PLEASE RECOLLECT.     CALLED TO J.WOOSLEY,RN 0001 08/31/14 M.CAMPBELL   Report Status 08/31/2014 FINAL   Final     Labs: Basic Metabolic Panel:  Recent Labs Lab 08/28/14 2304 08/30/14 1517 08/30/14 1905 08/31/14 0234 09/01/14 0525  NA 139 141  --  140 140  K 3.0* 4.1  --  4.0 4.0  CL 101 105  --  103 106  CO2 22 15*  --  21 23  GLUCOSE 111* 133*  --  150* 95  BUN 9 12  --  12 21  CREATININE 1.10 0.95 0.94 0.96 1.12  CALCIUM 9.1 9.3  --  8.7 8.6   Liver Function Tests:  Recent Labs Lab 08/31/14 0234 09/01/14 0525  AST 19 12  ALT 34 34  ALKPHOS 57 55  BILITOT <0.2* 0.2*  PROT 6.5 6.0  ALBUMIN 3.3* 3.1*   CBC:  Recent Labs Lab 08/28/14 2304 08/30/14 1517 08/30/14 1905 08/31/14 0234 09/01/14 0525  WBC 10.5 13.0* 14.0* 13.6* 16.0*  NEUTROABS 7.3  --   --  12.3* 11.5*  HGB 15.1 14.5 13.0 12.7* 13.4  HCT 43.8 42.0 37.4* 36.6* 39.4  MCV 84.7 86.6 85.6 85.5 87.2   PLT 287 344 326 327  333   Cardiac Enzymes:  Recent Labs Lab 08/31/14 1057 08/31/14 1534 08/31/14 2122  TROPONINI <0.30 <0.30 <0.30      Signed:  Elvis Coilooper, Rebecca, PA-S2  Algis DownsMarianne York, PA-C [facility] 09/02/2014, 10:08 AM  Attending Patient was seen, examined,treatment plan was discussed with the  Advance Practice Provider.  I have directly reviewed the clinical findings, lab, imaging studies and management of this patient in detail. I have made the necessary changes to the above noted documentation, and agree with the documentation, as recorded by the Advance Practice Provider.   In short, significantly improved, moving air without any rhonchi. Stable for discharge  Windell NorfolkS Ghimire MD [facility].

## 2014-09-02 NOTE — Clinical Social Work Note (Signed)
CSW provided shelter resources and information for low income housing in [place]. CSW signing off at this time.  Roddie McBryant Deva Ron MSW, [place]CSWA, [place], [phone]

## 2014-09-02 NOTE — Progress Notes (Deleted)
Physician Discharge Summary  Jose MunroCorvallis Jefcoat MVH:[id number] DOB: 04/11/1986 DOA: 08/30/2014  PCP: No PCP Per Patient  Admit date: 08/30/2014 Discharge date: 09/02/2014  Time spent: 45 minutes  Recommendations for Outpatient Follow-up:  1. Recheck CBC. Leukocytosis inpatient probable secondary to steroid use.  2. Assess asthma severity in the state of non-exacerbation, and determine attainable future asthma management.   Discharge Diagnoses:  Principal Problem:   Asthma with acute exacerbation Active Problems:   Acute respiratory failure   Tobacco abuse   Respiratory alkalosis   Metabolic acidosis   CAP (community acquired pneumonia)   Chest pain   Abnormal EKG   Discharge Condition: stable  Diet recommendation: Heart Healthy  Filed Weights   08/30/14 2232  Weight: 111.585 kg (246 lb)    History of present illness:  Jose Diaz is a homeless 28 year old male with a history of asthma and cigarette smoking who presented to the ED 10/6 with acute respiratory failure and hypoxia secondary to asthma exacerbation. He was seen in the ED 10/4 for same, with the intention of admission, but patient eloped due to family emergency. When he returned with worsened symptoms, ABG revealed mixed respiratory alkalosis and metabolic acidosis and CXR showed hypoventilation and RLL atelectasis. While inpatient, he reported chest pain, which was found to be related to GERD and probable musculoskeletal secondary to cough. EKG with some abnormalities, but after review of EKG, and [facility], it was determined these changes were not new in nature. [facility].   On day of discharge the patient is stable and reports breathing has improved. He is no longer complaining of chest pain, and has no other complaints. Patient and girlfriend are currently living in their car, but plan on staying with girlfriend's aunt after discharge.  Hospital Course:    Asthma exacerbation  - Improved drastically.  -CXR from ED reveals Atelectasis and peribronchial thickening consistent with history of asthma--Empirically treated with rocephin and azithro for CAP coverage due to increased susceptibility for infection. -Continue albuterol, symbicort, flonase, afrin, claritin on discharge. Also discharged with prednisone taper.   Resp alkalosis/metabolic acidosis  -Resolved.  -Mixed disorder present on arrival to ED  -Likely multifactorial with contributions from recurrent increased WOB and post tussive emesis  -Anion gap 11 on 10/8   Chest Pain  -Symptoms occurred 2x. Notably each time was after receiving solumedrol.  -Suspect CP worsened by Anxiety (patient recently lost a child and is homeless)  -2 types, a burning pain, and pleuritic pain. Burning sensation likely can be contributed to GERD based on quality and location, and history of heart burn. Pleuritic pain probably secondary to coughing.  -EKG shows normal sinus rhythm with LVH and T wave inversions in V3 and V4 (which are found to be consistent from EKG from 2012), DDimer normal , Troponin in ED was 0.01. Repeat troponin WNL, ECHO reveals minimal LVH with normal EF.  -GI cocktail and Protonix relieved symptoms. Omeprazole ordered for discharge.   Leukocytosis -WBC 16.0 on 10/8 -Likely secondary to steroid use. -No overt signs of infection.  -Recheck as outpatient.   Procedures:  ECHO (10/7)  Consultations:  Cardiology  Case Management  Discharge Exam: Filed Vitals:   09/02/14 0513  BP: 129/77  Pulse: 86  Temp: 98.3 F (36.8 C)  Resp: 20    General: Well developed, well nourished male lying in bed in NAD, appears stated age  Cardiovascular: RRR, normal S1 S2, no rubs, clicks, murmurs  or gallops.  Respiratory: CTA bilaterally, no wheezes, rales or rhonchi. Abdomen: Soft, nontender, nondistended, + bowel sounds  Neuro: AAOx3, cranial nerves grossly intact.  Psych: Pleasant  patient with normal affect and demeanor, intact judgement and insight   Discharge Instructions  [facility]. Take all of your other medications as directed.   Current Discharge Medication List    CONTINUE these medications which have NOT CHANGED   Details  albuterol (PROVENTIL HFA;VENTOLIN HFA) 108 (90 BASE) MCG/ACT inhaler Inhale 2 puffs into the lungs every 4 (four) hours as needed for wheezing or shortness of breath. Qty: 1 Inhaler, Refills: 3       No Known Allergies Follow-up Information   Follow up with [facility]     On 09/07/2014. (2:15  for hospital follow up )    Contact information:   [address] [place] [address] [phone]      The results of significant diagnostics from this hospitalization (including imaging, microbiology, ancillary and laboratory) are listed below for reference.    Significant Diagnostic Studies: Dg Chest 2 View  08/29/2014   CLINICAL DATA:  Acute onset of bilateral chest pain and shortness of breath. Current history of moderate asthma. Initial encounter.  EXAM: CHEST  2 VIEW  COMPARISON:  None.  FINDINGS: The lungs are well-aerated. Peribronchial thickening likely reflects the patient's history of asthma. There is no evidence of focal opacification, pleural effusion or pneumothorax.  The heart is normal in size; the mediastinal contour is within normal limits. No acute osseous abnormalities are seen.  IMPRESSION: 1. Peribronchial thickening likely reflects the patient's history of asthma. 2. Otherwise unremarkable chest radiograph.   Electronically Signed   By: Roanna Raider M.D.   On: 08/29/2014 00:22   Dg Chest Portable 1 View  08/30/2014   CLINICAL DATA:  Short of breath  EXAM: PORTABLE CHEST - 1 VIEW  COMPARISON:  08/28/2014  FINDINGS: Decreased lung volume with mild atelectasis in the right lung base. Negative for heart failure or pneumonia.  IMPRESSION:  Mild hypoventilation with right lower lobe atelectasis.   Electronically Signed   By: Marlan Palau M.D.   On: 08/30/2014 16:17    Microbiology: Recent Results (from the past 240 hour(s))  CULTURE, BLOOD (ROUTINE X 2)     Status: None   Collection Time    08/30/14 10:19 PM      Result Value Ref Range Status   Specimen Description BLOOD RIGHT ARM   Final   Special Requests BOTTLES DRAWN AEROBIC AND ANAEROBIC 8CC   Final   Culture  Setup Time     Final   Value: 08/31/2014 07:03     Performed at [facility]   Culture     Final   Value:        BLOOD CULTURE RECEIVED NO GROWTH TO DATE CULTURE WILL BE HELD FOR 5 DAYS BEFORE ISSUING A FINAL NEGATIVE REPORT     Performed at [facility]   Report Status PENDING   Incomplete  CULTURE, BLOOD (ROUTINE X 2)     Status: None   Collection Time    08/30/14 10:31 PM      Result Value Ref Range Status   Specimen Description BLOOD LEFT HAND   Final   Special Requests BOTTLES DRAWN AEROBIC ONLY 3CC   Final   Culture  Setup Time     Final   Value: 08/31/2014 01:55  Performed at [facility]   Culture     Final   Value:        BLOOD CULTURE RECEIVED NO GROWTH TO DATE CULTURE WILL BE HELD FOR 5 DAYS BEFORE ISSUING A FINAL NEGATIVE REPORT     Performed at [facility]   Report Status PENDING   Incomplete  CULTURE, EXPECTORATED SPUTUM-ASSESSMENT     Status: None   Collection Time    08/30/14 11:30 PM      Result Value Ref Range Status   Specimen Description SPUTUM   Final   Special Requests NONE   Final   Sputum evaluation     Final   Value: MICROSCOPIC FINDINGS SUGGEST THAT THIS SPECIMEN IS NOT REPRESENTATIVE OF LOWER RESPIRATORY SECRETIONS. PLEASE RECOLLECT.     CALLED TO J.WOOSLEY,RN 0001 08/31/14 M.CAMPBELL   Report Status 08/31/2014 FINAL   Final     Labs: Basic Metabolic Panel:  Recent Labs Lab 08/28/14 2304 08/30/14 1517 08/30/14 1905 08/31/14 0234 09/01/14 0525  NA 139 141  --  140 140  K 3.0*  4.1  --  4.0 4.0  CL 101 105  --  103 106  CO2 22 15*  --  21 23  GLUCOSE 111* 133*  --  150* 95  BUN 9 12  --  12 21  CREATININE 1.10 0.95 0.94 0.96 1.12  CALCIUM 9.1 9.3  --  8.7 8.6   Liver Function Tests:  Recent Labs Lab 08/31/14 0234 09/01/14 0525  AST 19 12  ALT 34 34  ALKPHOS 57 55  BILITOT <0.2* 0.2*  PROT 6.5 6.0  ALBUMIN 3.3* 3.1*   CBC:  Recent Labs Lab 08/28/14 2304 08/30/14 1517 08/30/14 1905 08/31/14 0234 09/01/14 0525  WBC 10.5 13.0* 14.0* 13.6* 16.0*  NEUTROABS 7.3  --   --  12.3* 11.5*  HGB 15.1 14.5 13.0 12.7* 13.4  HCT 43.8 42.0 37.4* 36.6* 39.4  MCV 84.7 86.6 85.6 85.5 87.2  PLT 287 344 326 327 333   Cardiac Enzymes:  Recent Labs Lab 08/31/14 1057 08/31/14 1534 08/31/14 2122  TROPONINI <0.30 <0.30 <0.30      Signed:  Elvis Coilooper, Janeese Mcgloin, PA-S2  [facility] 09/02/2014, 10:08 AM

## 2014-09-05 NOTE — ED Provider Notes (Signed)
Medical screening examination/treatment/procedure(s) were performed by non-physician practitioner and as supervising physician I was immediately available for consultation/collaboration.   EKG Interpretation   Date/Time:  Tuesday August 30 2014 15:05:36 EDT Ventricular Rate:  119 PR Interval:  128 QRS Duration: 92 QT Interval:  324 QTC Calculation: 455 R Axis:   70 Text Interpretation:  Sinus tachycardia T wave abnormality, consider  inferior ischemia T wave abnormality, consider anterolateral ischemia  Abnormal ECG ED PHYSICIAN INTERPRETATION AVAILABLE IN [facility]  Confirmed by TEST, Record ([id number]) on 09/01/2014 7:40:25 AM        Samuel JesterKathleen Tionne Carelli, DO 09/05/14 1300

## 2014-09-06 LAB — CULTURE, BLOOD (ROUTINE X 2)
CULTURE: NO GROWTH
Culture: NO GROWTH

## 2014-09-07 ENCOUNTER — Ambulatory Visit: Payer: Self-pay

## 2014-09-07 ENCOUNTER — Telehealth: Payer: Self-pay | Admitting: *Deleted

## 2014-09-07 NOTE — Telephone Encounter (Signed)
Patient missed today's scheduled appt Message left on patient's VM to return call to discuss.

## 2015-01-05 ENCOUNTER — Emergency Department (HOSPITAL_COMMUNITY)
Admission: EM | Admit: 2015-01-05 | Discharge: 2015-01-06 | Disposition: A | Discharge status: Home / Self Care | Payer: Self-pay | Attending: Emergency Medicine | Admitting: Emergency Medicine

## 2015-01-05 ENCOUNTER — Emergency Department (HOSPITAL_COMMUNITY): Payer: Self-pay

## 2015-01-05 ENCOUNTER — Encounter (HOSPITAL_COMMUNITY): Payer: Self-pay | Admitting: Emergency Medicine

## 2015-01-05 DIAGNOSIS — R197 Diarrhea, unspecified: Secondary | ICD-10-CM | POA: Insufficient documentation

## 2015-01-05 DIAGNOSIS — R1084 Generalized abdominal pain: Secondary | ICD-10-CM

## 2015-01-05 DIAGNOSIS — R195 Other fecal abnormalities: Secondary | ICD-10-CM | POA: Insufficient documentation

## 2015-01-05 DIAGNOSIS — Z79899 Other long term (current) drug therapy: Secondary | ICD-10-CM | POA: Insufficient documentation

## 2015-01-05 DIAGNOSIS — R1110 Vomiting, unspecified: Secondary | ICD-10-CM | POA: Insufficient documentation

## 2015-01-05 DIAGNOSIS — K921 Melena: Secondary | ICD-10-CM

## 2015-01-05 DIAGNOSIS — Z7951 Long term (current) use of inhaled steroids: Secondary | ICD-10-CM | POA: Insufficient documentation

## 2015-01-05 DIAGNOSIS — Z7952 Long term (current) use of systemic steroids: Secondary | ICD-10-CM | POA: Insufficient documentation

## 2015-01-05 DIAGNOSIS — J45909 Unspecified asthma, uncomplicated: Secondary | ICD-10-CM | POA: Insufficient documentation

## 2015-01-05 DIAGNOSIS — K648 Other hemorrhoids: Secondary | ICD-10-CM | POA: Insufficient documentation

## 2015-01-05 DIAGNOSIS — Z720 Tobacco use: Secondary | ICD-10-CM | POA: Insufficient documentation

## 2015-01-05 LAB — COMPREHENSIVE METABOLIC PANEL
ALBUMIN: 4.3 g/dL (ref 3.5–5.2)
ALT: 32 U/L (ref 0–53)
AST: 20 U/L (ref 0–37)
Alkaline Phosphatase: 69 U/L (ref 39–117)
Anion gap: 7 (ref 5–15)
BILIRUBIN TOTAL: 0.6 mg/dL (ref 0.3–1.2)
BUN: 12 mg/dL (ref 6–23)
CHLORIDE: 104 mmol/L (ref 96–112)
CO2: 26 mmol/L (ref 19–32)
Calcium: 9.1 mg/dL (ref 8.4–10.5)
Creatinine, Ser: 0.96 mg/dL (ref 0.50–1.35)
GFR calc Af Amer: >90 mL/min (ref >90)
Glucose, Bld: 95 mg/dL (ref 70–99)
Potassium: 3.6 mmol/L (ref 3.5–5.1)
SODIUM: 137 mmol/L (ref 135–145)
Total Protein: 7.2 g/dL (ref 6.0–8.3)

## 2015-01-05 LAB — CBC
HCT: 45.1 % (ref 39.0–52.0)
Hemoglobin: 15.5 g/dL (ref 13.0–17.0)
MCH: 29.6 pg (ref 26.0–34.0)
MCHC: 34.4 g/dL (ref 30.0–36.0)
MCV: 86.2 fL (ref 78.0–100.0)
Platelets: 322 10*3/uL (ref 150–400)
RBC: 5.23 MIL/uL (ref 4.22–5.81)
RDW: 12.8 % (ref 11.5–15.5)
WBC: 7.3 10*3/uL (ref 4.0–10.5)

## 2015-01-05 LAB — PROTIME-INR
INR: 0.93 (ref 0.00–1.49)
PROTHROMBIN TIME: 12.6 s (ref 11.6–15.2)

## 2015-01-05 LAB — TYPE AND SCREEN
ABO/RH(D): A POS
ANTIBODY SCREEN: NEGATIVE

## 2015-01-05 LAB — ABO/RH: ABO/RH(D): A POS

## 2015-01-05 MED ORDER — IOHEXOL 300 MG/ML  SOLN
100.0000 mL | Freq: Once | INTRAMUSCULAR | Status: AC | PRN
  Administered 2015-01-05: 100 mL via INTRAVENOUS

## 2015-01-05 MED ORDER — ONDANSETRON HCL 4 MG/2ML IJ SOLN
4.0000 mg | Freq: Once | INTRAMUSCULAR | Status: AC
  Administered 2015-01-05: 4 mg via INTRAVENOUS
  Filled 2015-01-05: qty 2

## 2015-01-05 MED ORDER — OMEPRAZOLE 40 MG PO CPDR: 40.0000 mg | DELAYED_RELEASE_CAPSULE | Freq: Every day | ORAL | Status: AC

## 2015-01-05 MED ORDER — MORPHINE SULFATE 4 MG/ML IJ SOLN
4.0000 mg | Freq: Once | INTRAMUSCULAR | Status: AC
  Administered 2015-01-05: 4 mg via INTRAVENOUS
  Filled 2015-01-05: qty 1

## 2015-01-05 MED ORDER — PANTOPRAZOLE SODIUM 40 MG IV SOLR
40.0000 mg | Freq: Once | INTRAVENOUS | Status: AC
  Administered 2015-01-05: 40 mg via INTRAVENOUS
  Filled 2015-01-05: qty 40

## 2015-01-05 MED ORDER — IOHEXOL 300 MG/ML  SOLN: 25.0000 mL | Freq: Once | INTRAMUSCULAR | Status: AC | PRN

## 2015-01-05 MED ORDER — SODIUM CHLORIDE 0.9 % IV BOLUS (SEPSIS)
1000.0000 mL | Freq: Once | INTRAVENOUS | Status: AC
  Administered 2015-01-05: 1000 mL via INTRAVENOUS

## 2015-01-05 NOTE — Discharge Instructions (Signed)

## 2015-01-05 NOTE — ED Notes (Signed)
Pt reports since yesterday he has been vomiting blood and having bloody stools with abdominal tightness. Pt sts he feels weak.

## 2015-01-05 NOTE — ED Provider Notes (Signed)
CSN: [id number]     Arrival date & time 01/05/15  1701 History   First MD Initiated Contact with Patient 01/05/15 1832     Chief Complaint  Patient presents with  . Hematemesis  . Weakness     (Consider location/radiation/quality/duration/timing/severity/associated sxs/prior Treatment) Patient is a 29 y.o. male presenting with abdominal pain. The history is provided by the patient.  Abdominal Pain Pain location: generalized and LLQ. Pain quality: aching and gnawing   Pain radiates to:  Does not radiate Pain severity:  Mild Onset quality:  Gradual Duration:  1 day Timing:  Constant Progression:  Unchanged Chronicity:  New Context: not trauma   Relieved by:  None tried Worsened by:  Palpation and eating Ineffective treatments:  None tried Associated symptoms: diarrhea, hematochezia and vomiting   Associated symptoms: no chest pain, no chills, no cough, no dysuria, no fever, no hematuria, no nausea, no shortness of breath and no sore throat   Risk factors: no NSAID use     Past Medical History  Diagnosis Date  . Asthma    Past Surgical History  Procedure Laterality Date  . Hand surgery     No family history on file. History  Substance Use Topics  . Smoking status: Current Every Day Smoker -- 0.50 packs/day    Types: Cigarettes  . Smokeless tobacco: Never Used  . Alcohol Use: No    Review of Systems  Constitutional: Negative for fever and chills.  HENT: Negative for congestion, rhinorrhea and sore throat.   Eyes: Negative for visual disturbance.  Respiratory: Negative for cough and shortness of breath.   Cardiovascular: Negative for chest pain.  Gastrointestinal: Positive for vomiting, abdominal pain, diarrhea, blood in stool and hematochezia. Negative for nausea.  Genitourinary: Negative for dysuria, hematuria and testicular pain.  Musculoskeletal: Negative for neck pain.  Skin: Negative for rash.  Allergic/Immunologic: Negative for immunocompromised state.   Neurological: Negative for dizziness, syncope, weakness and headaches.      Allergies  Review of patient's allergies indicates no known allergies.  Home Medications   Prior to Admission medications   Medication Sig Start Date End Date Taking? Authorizing Provider  albuterol (PROVENTIL HFA;VENTOLIN HFA) 108 (90 BASE) MCG/ACT inhaler Inhale 2 puffs into the lungs every 4 (four) hours as needed for wheezing or shortness of breath. 09/02/14   Tora KindredMarianne L York, PA-C  budesonide-formoterol (SYMBICORT) 160-4.5 MCG/ACT inhaler Inhale 2 puffs into the lungs 2 (two) times daily. 09/02/14   Tora KindredMarianne L York, PA-C  chlorpheniramine-HYDROcodone (TUSSIONEX) 10-8 MG/5ML LQCR Take 5 mLs by mouth at bedtime as needed for cough. 09/02/14   Tora KindredMarianne L York, PA-C  fluticasone (FLONASE) 50 MCG/ACT nasal spray Place 1 spray into both nostrils daily. 09/02/14   Tora KindredMarianne L York, PA-C  loratadine (CLARITIN) 10 MG tablet Take 1 tablet (10 mg total) by mouth daily. 09/02/14   Tora KindredMarianne L York, PA-C  nicotine (EQ NICOTINE) 14 mg/24hr patch Place 1 patch (14 mg total) onto the skin daily. 09/02/14   Tora KindredMarianne L York, PA-C  omeprazole (PRILOSEC) 40 MG capsule Take 1 capsule (40 mg total) by mouth daily. 09/02/14   Tora KindredMarianne L York, PA-C  predniSONE (DELTASONE) 20 MG tablet Take 4 tablets on days 1-2, take 3 tabs on days 3-4, take 2 tabs on days 5-6, take on tab on days 7-8. 09/02/14   Tora KindredMarianne L York, PA-C   BP 149/89 mmHg  Pulse 82  Temp(Src) 98.3 F (36.8 C) (Oral)  Resp 18  Ht 6' (1.829 m)  Wt 267 lb 1.6 oz (121.156 kg)  BMI 36.22 kg/m2  SpO2 98% Physical Exam  Constitutional: He is oriented to person, place, and time. He appears well-developed and well-nourished. No distress.  HENT:  Head: Normocephalic.  Mouth/Throat: No oropharyngeal exudate.  Eyes: Pupils are equal, round, and reactive to light.  Neck: Neck supple.  Cardiovascular: Normal rate, normal heart sounds and intact distal pulses.   No murmur  heard. Pulmonary/Chest: Effort normal. No respiratory distress. He has no wheezes. He has no rales.  Abdominal: Soft. He exhibits no distension. There is tenderness (minimal) in the left lower quadrant. There is no rigidity, no rebound, no guarding, no tenderness at McBurney's point and negative Murphy's sign.  Genitourinary: Rectal exam shows internal hemorrhoid (non-tender, non-thrombosed). Rectal exam shows no external hemorrhoid and anal tone normal. Guaiac positive stool.  Soft brown stool in rectal vault  Musculoskeletal: He exhibits no tenderness.  Neurological: He is alert and oriented to person, place, and time.  Skin: Skin is warm. No rash noted.  Nursing note and vitals reviewed.   ED Course  Procedures (including critical care time) Labs Review Labs Reviewed  URINALYSIS, DIPSTICK ONLY - Abnormal; Notable for the following:    Specific Gravity, Urine >1.046 (*)    Ketones, ur 15 (*)    All other components within normal limits  POC OCCULT BLOOD, ED - Abnormal; Notable for the following:    Fecal Occult Bld POSITIVE (*)    All other components within normal limits  CBC  COMPREHENSIVE METABOLIC PANEL  PROTIME-INR  TYPE AND SCREEN  ABO/RH    Imaging Review Ct Abdomen Pelvis W Contrast  01/05/2015   CLINICAL DATA:  Acute onset of left lower quadrant abdominal pain, abdominal tightness and hematemesis. Initial encounter.  EXAM: CT ABDOMEN AND PELVIS WITH CONTRAST  TECHNIQUE: Multidetector CT imaging of the abdomen and pelvis was performed using the standard protocol following bolus administration of intravenous contrast.  CONTRAST:  OMNIPAQUE IOHEXOL 300 MG/ML  SOLN  COMPARISON:  None.  FINDINGS: The visualized lung bases are clear.  The liver and spleen are unremarkable in appearance. The gallbladder is within normal limits. The pancreas and adrenal glands are unremarkable.  The kidneys are unremarkable in appearance. There is no evidence of hydronephrosis. No renal or  ureteral stones are seen. No perinephric stranding is appreciated.  No free fluid is identified. The small bowel is unremarkable in appearance. The stomach is within normal limits. No acute vascular abnormalities are seen.  The appendix is normal in caliber, without evidence for appendicitis. The colon is unremarkable in appearance.  The bladder is mildly distended and grossly unremarkable. The prostate remains normal in size. No inguinal lymphadenopathy is seen.  No acute osseous abnormalities are identified.  IMPRESSION: No acute abnormality seen within the abdomen or pelvis.   Electronically Signed   By: Roanna Raider M.D.   On: 01/05/2015 23:35     EKG Interpretation None      MDM   Final diagnoses:  Generalized abdominal pain  Blood in stool   29 yo M with PMHx of asthma who p/w a 1-day h/o abdominal pain, hematochezia, and vomiting. See HPI above. On arrival, T 98.38F, HR 82, RR 18, BP 149/89, satting 98% on RA. Exam as above, pt overall well-appearing in NAD. Abdomen with only mild TTP, no rebound or guarding. Rectal exam with soft stool, heme-occult positive, with internal hemorrhoids but no prolapse or bleeding.  Pt's presentation is most c/w acute viral  GI illness, with vomiting, mild abd pain, and diarrhea with subsequent mucosal irritation versus irritation of internal hemorrhoid leading to melena. Abdomen is soft without signs of SBO or volvulus. No evidence of peritonitis and VSS with no signs of systemic illness. DDx includes gastritis, ileitis, colitis, less likely appendicitis. No RUQ TTP or s/s cholecystitis. No dysuria, frequency, or sx of UTI. No testicular pain, swelling, or sx to suggest testicular torsion.  Labs as above. CBC with no leukocytosis, CMP unremarkable. INR 0.93. UA with mild dehydration, pt has received 1L NS bolus and is tolerating PO. Awaiting CT scan at this time. VSS. As mentioned, FOB positive but likely 2/2 hemorrhoidal irritation with no evidence of  prolapse or thrombosis.   CT A/P shows no acute abnormality. VSS. Pain resolved and pt tolerating PO. Abdomen remains soft, NT, ND. Will d/c with GI medicine f/u, instructions for OTC stool softeners and hemorrhoidal tx, and PPI for possible gastritis component. Pt in agreement with this plan. Will d/c home.  Clinical Impression: 1. Generalized abdominal pain   2. Blood in stool     Disposition: Discharge  Condition: Good  I have discussed the results, Dx and Tx plan with the pt(& family if present). He/she/they expressed understanding and agree(s) with the plan. Discharge instructions discussed at great length. Strict return precautions discussed and pt &/or family have verbalized understanding of the instructions. No further questions at time of discharge.    Discharge Medication List as of 01/05/2015 11:46 PM      Follow Up: [facility] OFF[facility] [address] [place] [address]  In 1 week For follow-up of your bleeding and abdominal pain   Pt seen in conjunction with Dr. Arlyn Leak, MD 01/06/15 4098  Mirian Mo, MD 01/09/15 [phone]

## 2015-01-06 LAB — POC OCCULT BLOOD, ED: Fecal Occult Bld: POSITIVE — AB

## 2015-01-06 LAB — URINALYSIS, DIPSTICK ONLY
BILIRUBIN URINE: NEGATIVE
GLUCOSE, UA: NEGATIVE mg/dL
HGB URINE DIPSTICK: NEGATIVE
Ketones, ur: 15 mg/dL — AB
Leukocytes, UA: NEGATIVE
Nitrite: NEGATIVE
Protein, ur: NEGATIVE mg/dL
Specific Gravity, Urine: >1.046 — ABNORMAL HIGH (ref 1.005–1.030)
UROBILINOGEN UA: 0.2 mg/dL (ref 0.0–1.0)
pH: 5.5 (ref 5.0–8.0)

## 2015-09-10 ENCOUNTER — Emergency Department (HOSPITAL_COMMUNITY)
Admission: EM | Admit: 2015-09-10 | Discharge: 2015-09-10 | Disposition: A | Discharge status: Home / Self Care | Payer: Self-pay | Attending: Emergency Medicine | Admitting: Emergency Medicine

## 2015-09-10 ENCOUNTER — Encounter (HOSPITAL_COMMUNITY): Payer: Self-pay

## 2015-09-10 DIAGNOSIS — J45909 Unspecified asthma, uncomplicated: Secondary | ICD-10-CM | POA: Insufficient documentation

## 2015-09-10 DIAGNOSIS — Z791 Long term (current) use of non-steroidal anti-inflammatories (NSAID): Secondary | ICD-10-CM | POA: Insufficient documentation

## 2015-09-10 DIAGNOSIS — Z7951 Long term (current) use of inhaled steroids: Secondary | ICD-10-CM | POA: Insufficient documentation

## 2015-09-10 DIAGNOSIS — Z720 Tobacco use: Secondary | ICD-10-CM | POA: Insufficient documentation

## 2015-09-10 DIAGNOSIS — M659 Synovitis and tenosynovitis, unspecified: Secondary | ICD-10-CM

## 2015-09-10 DIAGNOSIS — M65842 Other synovitis and tenosynovitis, left hand: Secondary | ICD-10-CM | POA: Insufficient documentation

## 2015-09-10 DIAGNOSIS — Z79899 Other long term (current) drug therapy: Secondary | ICD-10-CM | POA: Insufficient documentation

## 2015-09-10 MED ORDER — NAPROXEN 500 MG PO TABS: 500.0000 mg | ORAL_TABLET | Freq: Two times a day (BID) | ORAL | Status: AC

## 2015-09-10 NOTE — Progress Notes (Signed)
Orthopedic Tech Progress Note Patient Details:  Jonna MunroCorvallis Lorenson 09/15/1986 [id number]  Ortho Devices Type of Ortho Device: Thumb velcro splint Ortho Device/Splint Location: LUE Ortho Device/Splint Interventions: Ordered, Application   Jennye MoccasinHughes, Vadhir Mcnay Craig 09/10/2015, 8:43 PM

## 2015-09-10 NOTE — ED Provider Notes (Signed)
CSN: [id number]     Arrival date & time 09/10/15  1950 History  By signing my name below, I, Evon Slackerrance Branch, attest that this documentation has been prepared under the direction and in the presence of Arthor CaptainAbigail Symphani Eckstrom, PA-C. Electronically Signed: Evon Slackerrance Branch, ED Scribe. 09/11/2015. 2:05 AM.      Chief Complaint  Patient presents with  . Tingling  . Hand Pain   The history is provided by the patient. No language interpreter was used.   HPI Comments: Jose Diaz is a 29 y.o. male who presents to the Emergency Department complaining of left hand pain onset 2 days prior. Pt states that he has associated swelling to the thenar eminence. Pt states that there is a tightness that radiates up into his wrist. Pt states that has not been able to grip anything due to the pain. 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 Pt doesn't report any medications PTA. Pt states that he right hand dominant. Pt denies fever, chills, wound, numbness or weakness.   Past Medical History  Diagnosis Date  . Asthma    Past Surgical History  Procedure Laterality Date  . Hand surgery     No family history on file. Social History  Substance Use Topics  . Smoking status: Current Every Day Smoker -- 0.50 packs/day    Types: Cigarettes  . Smokeless tobacco: Never Used  . Alcohol Use: No    Review of Systems  Constitutional: Negative for fever and chills.  Musculoskeletal: Positive for joint swelling and arthralgias.  Skin: Negative for wound.  Neurological: Negative for weakness and numbness.  All other systems reviewed and are negative.    Allergies  Review of patient's allergies indicates no known allergies.  Home Medications   Prior to Admission medications   Medication Sig Start Date End Date Taking? Authorizing Provider  albuterol (PROVENTIL HFA;VENTOLIN HFA) 108 (90 BASE) MCG/ACT inhaler Inhale 2 puffs into the lungs every 4 (four) hours as needed for  wheezing or shortness of breath. 09/02/14   Tora KindredMarianne L York, PA-C  budesonide-formoterol (SYMBICORT) 160-4.5 MCG/ACT inhaler Inhale 2 puffs into the lungs 2 (two) times daily. 09/02/14   Tora KindredMarianne L York, PA-C  chlorpheniramine-HYDROcodone (TUSSIONEX) 10-8 MG/5ML LQCR Take 5 mLs by mouth at bedtime as needed for cough. 09/02/14   Tora KindredMarianne L York, PA-C  fluticasone (FLONASE) 50 MCG/ACT nasal spray Place 1 spray into both nostrils daily. 09/02/14   Tora KindredMarianne L York, PA-C  loratadine (CLARITIN) 10 MG tablet Take 1 tablet (10 mg total) by mouth daily. 09/02/14   Tora KindredMarianne L York, PA-C  naproxen (NAPROSYN) 500 MG tablet Take 1 tablet (500 mg total) by mouth 2 (two) times daily with a meal. 09/10/15   Arthor CaptainAbigail Chelise Hanger, PA-C  nicotine (EQ NICOTINE) 14 mg/24hr patch Place 1 patch (14 mg total) onto the skin daily. 09/02/14   Tora KindredMarianne L York, PA-C  omeprazole (PRILOSEC) 40 MG capsule Take 1 capsule (40 mg total) by mouth daily. 01/05/15   Shaune Pollackameron Isaacs, MD  predniSONE (DELTASONE) 20 MG tablet Take 4 tablets on days 1-2, take 3 tabs on days 3-4, take 2 tabs on days 5-6, take on tab on days 7-8. 09/02/14   Tora KindredMarianne L York, PA-C   BP 135/86 mmHg  Pulse 96  Temp(Src) 98.3 F (36.8 C) (Oral)  Resp 16  Ht 6' (1.829 m)  Wt 247 lb (112.038 kg)  BMI 33.49 kg/m2  SpO2 95%   Physical Exam  Constitutional: He is oriented to person, place, and time. He appears well-developed and well-nourished. No distress.  HENT:  Head: Normocephalic and atraumatic.  Eyes: Conjunctivae and EOM are normal.  Neck: Neck supple. No tracheal deviation present.  Cardiovascular: Normal rate.   Pulmonary/Chest: Effort normal. No respiratory distress.  Musculoskeletal: Normal range of motion. He exhibits tenderness.  swelling in the left thenar eminence, tender to palpation along the muscles of the thumb adduction and opposition, tender along forearm flexors on the radial side of arm.    Neurological: He is alert and oriented to person, place,  and time.  Skin: Skin is warm and dry.  Psychiatric: He has a normal mood and affect. His behavior is normal.  Nursing note and vitals reviewed.   ED Course  Procedures (including critical care time) DIAGNOSTIC STUDIES: Oxygen Saturation is 95% on RA, adequate by my interpretation.    COORDINATION OF CARE: 8:21 PM-Discussed treatment plan with pt at bedside and pt agreed to plan.     Labs Review Labs Reviewed - No data to display  Imaging Review No results found.    EKG Interpretation None      MDM   Final diagnoses:  Flexor tenosynovitis of thumb   Patient with findings on physical exam consistent with flexor tenosynovitis of the right thumb. Patient placed a thumb spica splint, he'll be discharged with anti-inflammatories, ice protocol, follow-up with hand specialist. Discussed signs of infective symptoms. No signs of infective tenosynovitis at this time. He appears safe for discharge at this time.   Lucila Maine, personally performed the services described in this documentation. All medical record entries made by the scribe were at my direction and in my presence.  I have reviewed the chart and discharge instructions and agree that the record reflects my personal performance and is accurate and complete. Arthor Captain.  09/11/2015. 2:06 AM.         Arthor Captain, PA-C 09/11/15 0206  Rolan Bucco, MD 09/11/15 1630

## 2015-09-10 NOTE — Discharge Instructions (Signed)
Tendinitis and Tenosynovitis  °Tendinitis is inflammation of the tendon. Tenosynovitis is inflammation of the lining around the tendon (tendon sheath). These painful conditions often occur at once. Tendons attach muscle to bone. To move a limb, force from the muscle moves through the tendon, to the bone. These conditions often cause increased pain when moving. Tendinitis may be caused by a small or partial tear in the tendon.  °SYMPTOMS  °· Pain, tenderness, redness, bruising, or swelling at the injury. °· Loss of normal joint movement. °· Pain that gets worse with use of the muscle and joint attached to the tendon. °· Weakness in the tendon, caused by calcium build up that may occur with tendinitis. °· Commonly affected tendons: °¨ Achilles tendon (calf of leg). °¨ Rotator cuff (shoulder joint). °¨ Patellar tendon (kneecap to shin). °¨ Peroneal tendon (ankle). °¨ Posterior tibial tendon (inner ankle). °¨ Biceps tendon (in front of shoulder). °CAUSES  °· Sudden strain on a flexed muscle, muscle overuse, sudden increase or change in activity, vigorous activity. °· Result of a direct hit (less common). °· Poor muscle action (biomechanics). °RISK INCREASES WITH: °· Injury (trauma). °· Too much exercise. °· Sudden change in athletic activity. °· Incorrect exercise form or technique. °· Poor strength and flexibility. °· Not warming-up properly before activity. °· Returning to activity before healing is complete. °PREVENTION  °· Warm-up and stretch properly before activity. °· Maintain physical fitness: °¨ Joint flexibility. °¨ Muscle strength and endurance. °¨ Fitness that increases heart rate. °· Learn and use proper exercise techniques. °· Use rehabilitation exercises to strengthen weak muscles and tendons. °· Ice the tendon after activity, to reduce recurring inflammation. °· Wear proper fitting protective equipment for specific tendons, when indicated. °PROGNOSIS  °When treated properly, can be cured in 6 to 8 weeks.  Recovery may take longer, depending on degree of injury.  °RELATED COMPLICATIONS  °· Re-injury or recurring symptoms. °· Permanent weakness or joint stiffness, if injury is severe and recovery is not completed. °· Delayed healing, if sports are started before healing is complete. °· Tearing apart (rupture) of the inflamed tendon. Tendinitis means the tendon is injured and must recover. °TREATMENT  °Treatment first involves ice, medicine, and rest from aggravating activities. This reduces pain and inflammation. Modifying your activity may be considered to prevent recurring injury. A brace, elastic bandage wrap, splint, cast, or sling may be prescribed to protect the joint for a short period. After that period, strengthening and stretching exercise may help to regain strength and full range of motion. If the condition persists, despite non-surgical treatment, surgery may be recommended to remove the inflamed tendon lining. Corticosteroid injections may be given to reduce inflammation. However, these injections may weaken the tendon and increase your risk for tendon rupture. °MEDICATION  °· If pain medicine is needed, nonsteroidal anti-inflammatory medicines (aspirin and ibuprofen), or other minor pain relievers (acetaminophen), are often recommended. °· Do not take pain medicine for 7 days before surgery. °· Prescription pain relievers are usually prescribed only after surgery. Use only as directed and only as much as you need. °· Ointments applied to the skin may be helpful. °· Corticosteroid injections may be given to reduce inflammation. However, this may increase your risk of a tendon rupture. °HEAT AND COLD °· Cold treatment (icing) relieves pain and reduces inflammation. Cold treatment should be applied for 10 to 15 minutes every 2 to 3 hours, and immediately after activity that aggravates your symptoms. Use ice packs or an ice massage. °· Heat   treatment may be used before performing stretching and strengthening  activities prescribed by your caregiver, physical therapist, or athletic trainer. Use a heat pack or a warm water soak. °SEEK MEDICAL CARE IF:  °· Symptoms get worse or do not improve, despite treatment. °· Pain becomes too much to tolerate. °· You develop numbness or tingling. °· Toes become cold, or toenails become blue, gray, or dark colored. °· New, unexplained symptoms develop. (Drugs used in treatment may produce side effects.) °  °This information is not intended to replace advice given to you by your health care provider. Make sure you discuss any questions you have with your health care provider. °  °Document Released: 11/11/2005 Document Revised: 02/03/2012 Document Reviewed: 02/23/2009 °Elsevier Interactive Patient Education ©2016 [employer] ° °

## 2015-09-10 NOTE — ED Notes (Signed)
Pt reports his left hand/wrist has been hurting since Friday, unsure if he injured it he states it feeling tingly. No swelling noted to hand and he is able to move all digits. No other symptoms other than hand pain.

## 2015-11-04 IMAGING — CT CT ABD-PELV W/ CM
2 of 4 series · 17 of 46 positions shown, 19 images · IV contrast (APPLIED)
Comparison: None.

CLINICAL DATA: Acute onset of left lower quadrant abdominal pain,
abdominal tightness and hematemesis. Initial encounter.

EXAM:
CT ABDOMEN AND PELVIS WITH CONTRAST
TECHNIQUE: Multidetector CT imaging of the abdomen and pelvis was performed
using the standard protocol following bolus administration of
intravenous contrast.
CONTRAST:  100mL OMNIPAQUE IOHEXOL 300 MG/ML  SOLN

[Series 2: abd/ pelvis 5.0 i30f 1 · axial · 0.93mm/px · z∈[+863,+1338]mm · 14 of 105 slices shown, 16 images]
[im 5/105  soft-tissue]
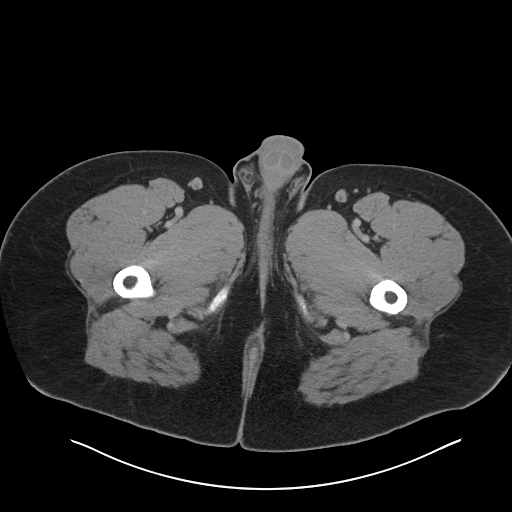
[im 5/105  bone]
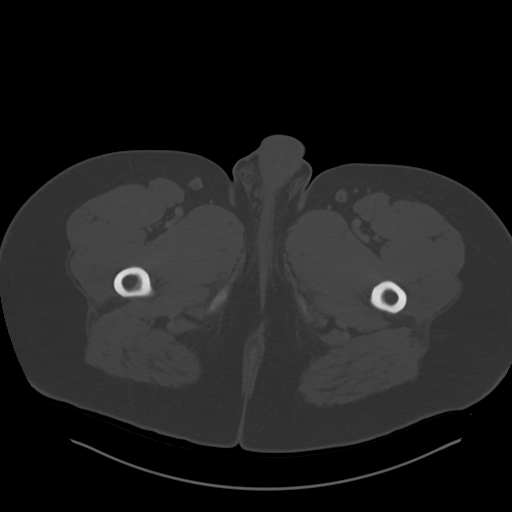
[im 14/105  soft-tissue]
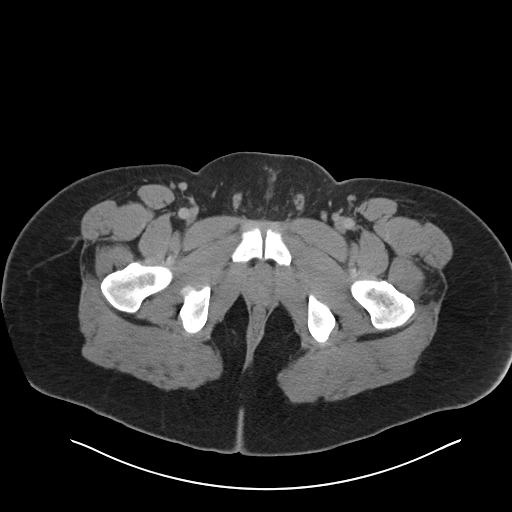
[im 19/105  soft-tissue]
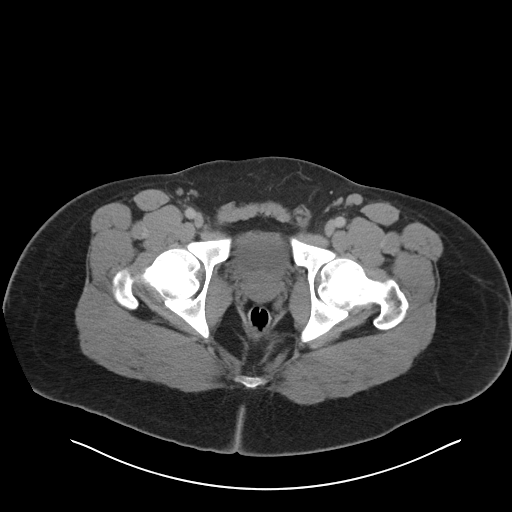
[im 28/105  soft-tissue]
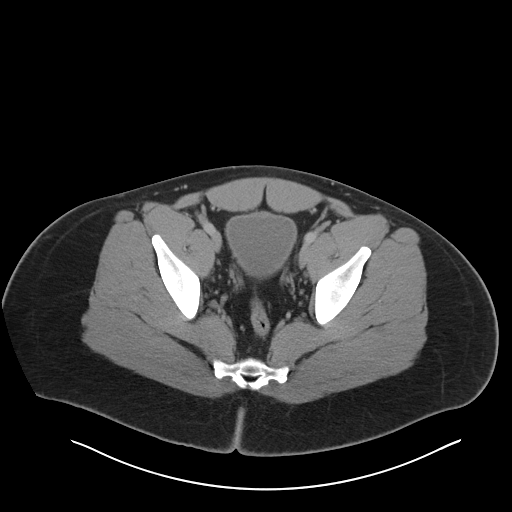
[im 37/105  soft-tissue]
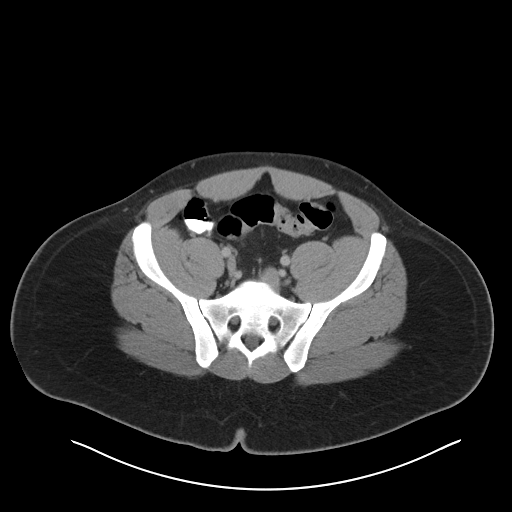
[im 41/105  soft-tissue]
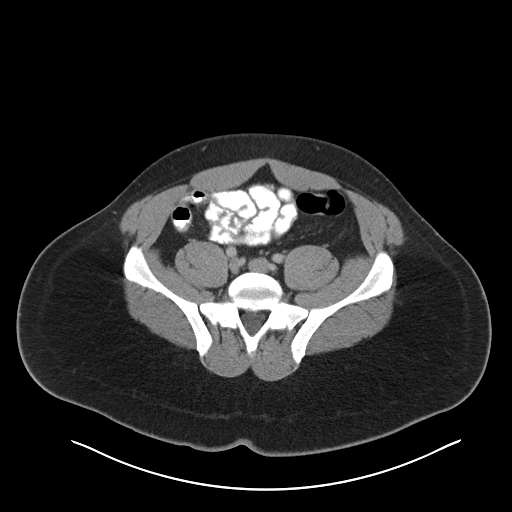
[im 50/105  soft-tissue]
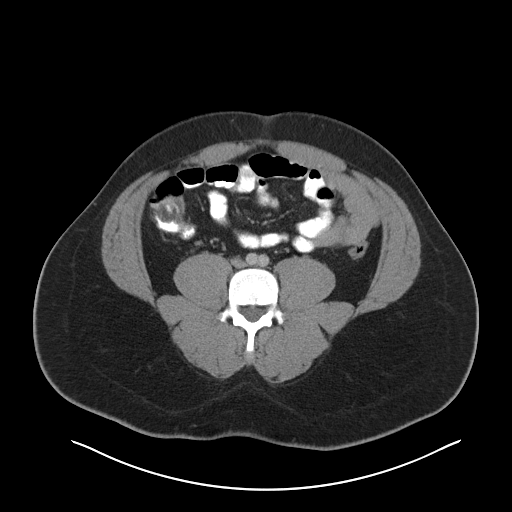
[im 55/105  soft-tissue]
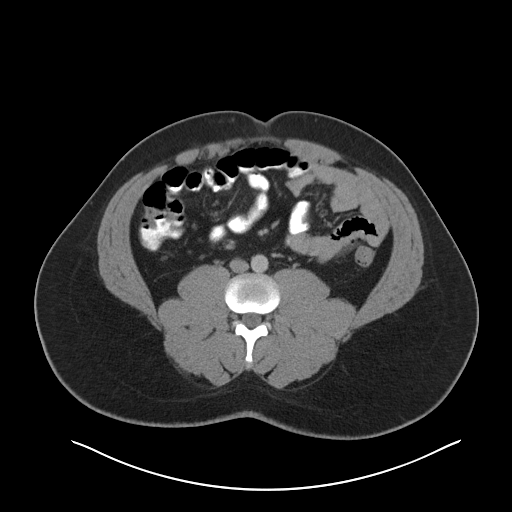
[im 64/105  soft-tissue]
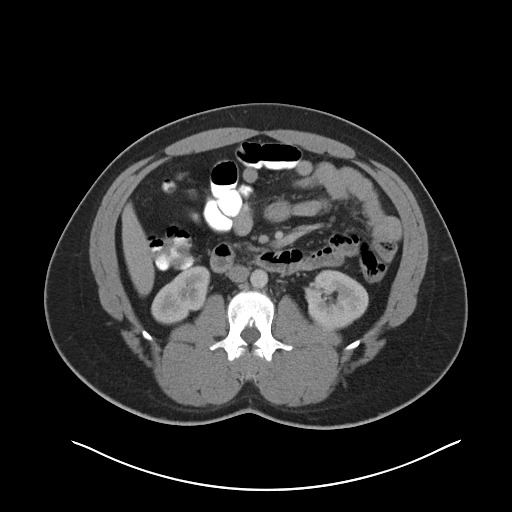
[im 64/105  bone]
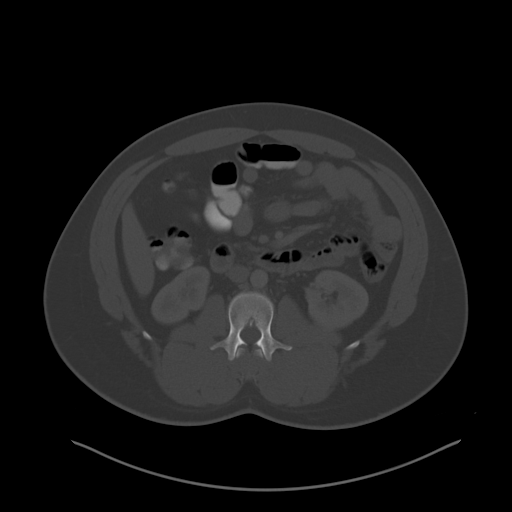
[im 68/105  soft-tissue]
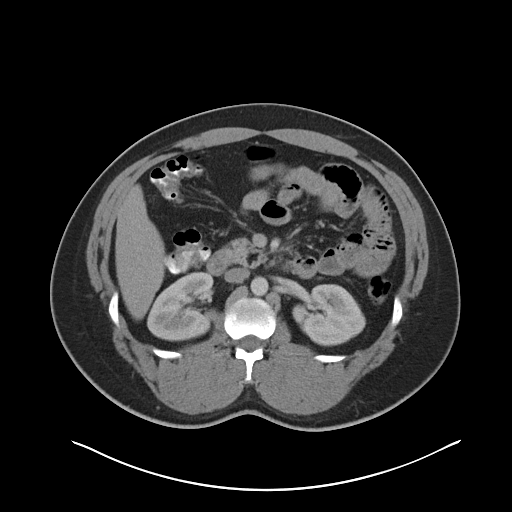
[im 77/105  soft-tissue]
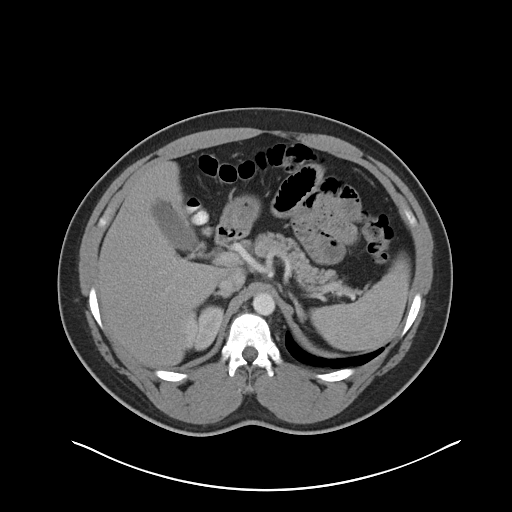
[im 86/105  soft-tissue]
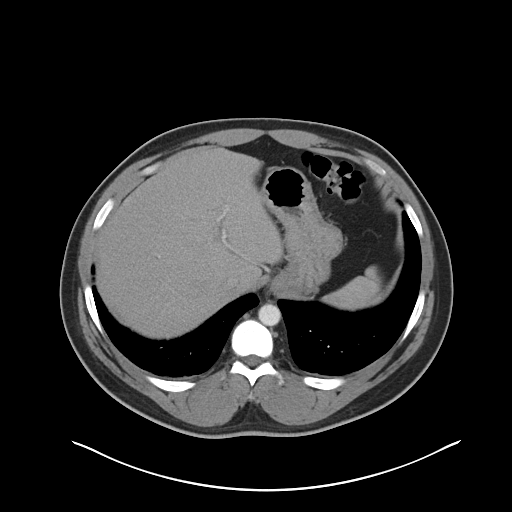
[im 91/105  soft-tissue]
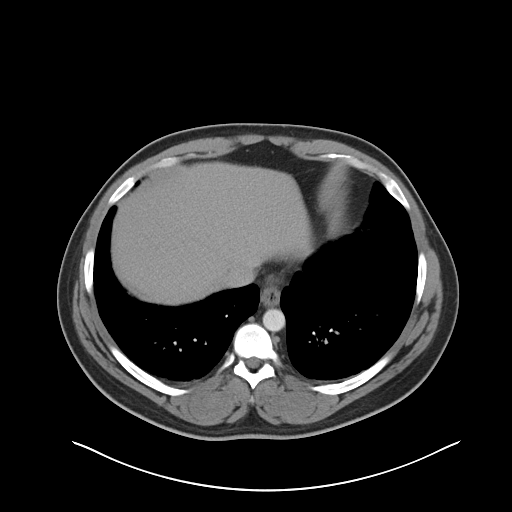
[im 100/105  soft-tissue]
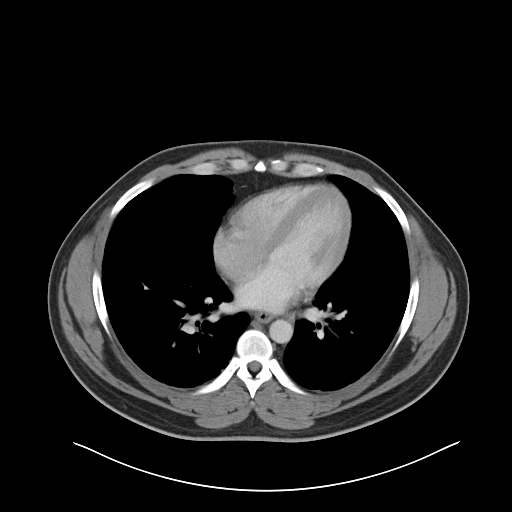

[Series 4: coronal soft tissue · coronal · 0.92mm/px · 3 of 90 slices shown]
[im 30/90  soft-tissue]
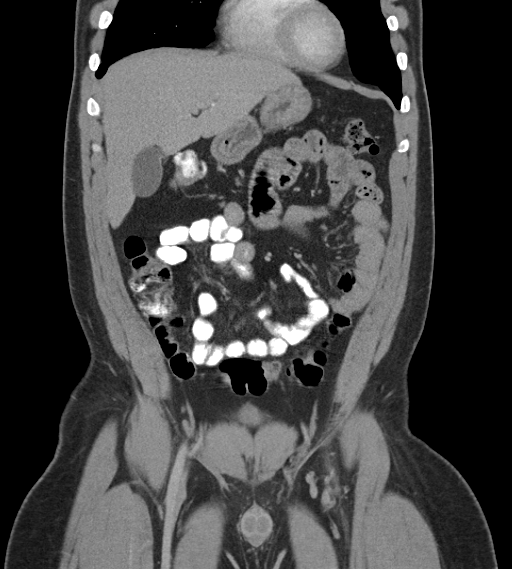
[im 40/90  soft-tissue]
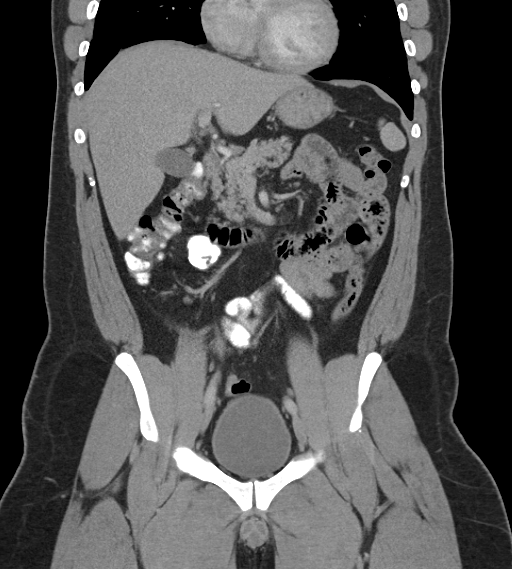
[im 50/90  soft-tissue]
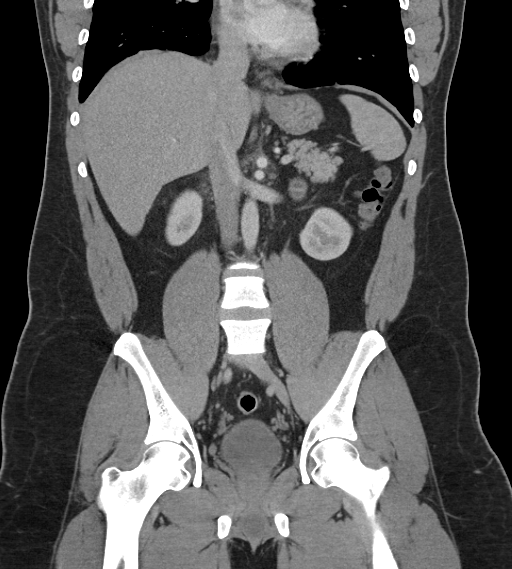

[17 of 46 positions shown; findings below may reference images not displayed]

FINDINGS: The visualized lung bases are clear.

The liver and spleen are unremarkable in appearance. The gallbladder
is within normal limits. The pancreas and adrenal glands are
unremarkable.

The kidneys are unremarkable in appearance. There is no evidence of
hydronephrosis. No renal or ureteral stones are seen. No perinephric
stranding is appreciated.

No free fluid is identified. The small bowel is unremarkable in
appearance. The stomach is within normal limits. No acute vascular
abnormalities are seen.

The appendix is normal in caliber, without evidence for
appendicitis. The colon is unremarkable in appearance.

The bladder is mildly distended and grossly unremarkable. The
prostate remains normal in size. No inguinal lymphadenopathy is
seen.

No acute osseous abnormalities are identified.
IMPRESSION: No acute abnormality seen within the abdomen or pelvis.
